# Patient Record
Sex: Male | Born: 1945 | Hispanic: No | Marital: Married | Smoking: Former smoker
Health system: Southern US, Community
[De-identification: ages and names within clinical notes are randomized; demographics above are authoritative.]

## PROBLEM LIST (undated history)

## (undated) DIAGNOSIS — I10 Essential (primary) hypertension: Secondary | ICD-10-CM

## (undated) DIAGNOSIS — K222 Esophageal obstruction: Secondary | ICD-10-CM

## (undated) DIAGNOSIS — K635 Polyp of colon: Secondary | ICD-10-CM

## (undated) DIAGNOSIS — M1990 Unspecified osteoarthritis, unspecified site: Secondary | ICD-10-CM

## (undated) DIAGNOSIS — E785 Hyperlipidemia, unspecified: Secondary | ICD-10-CM

## (undated) DIAGNOSIS — F1010 Alcohol abuse, uncomplicated: Secondary | ICD-10-CM

## (undated) DIAGNOSIS — K264 Chronic or unspecified duodenal ulcer with hemorrhage: Secondary | ICD-10-CM

## (undated) HISTORY — PX: EYE SURGERY: SHX253

## (undated) HISTORY — DX: Alcohol abuse, uncomplicated: F10.10

## (undated) HISTORY — DX: Polyp of colon: K63.5

## (undated) HISTORY — DX: Chronic or unspecified duodenal ulcer with hemorrhage: K26.4

## (undated) HISTORY — DX: Esophageal obstruction: K22.2

---

## 2009-06-17 ENCOUNTER — Ambulatory Visit: Payer: Self-pay | Admitting: Gastroenterology

## 2009-06-24 ENCOUNTER — Encounter: Payer: Self-pay | Admitting: Gastroenterology

## 2009-06-24 ENCOUNTER — Ambulatory Visit: Payer: Self-pay | Admitting: Gastroenterology

## 2009-06-24 DIAGNOSIS — K635 Polyp of colon: Secondary | ICD-10-CM

## 2009-06-24 HISTORY — DX: Polyp of colon: K63.5

## 2009-06-29 ENCOUNTER — Encounter: Payer: Self-pay | Admitting: Gastroenterology

## 2012-12-24 ENCOUNTER — Inpatient Hospital Stay (HOSPITAL_COMMUNITY): Payer: BC Managed Care – PPO

## 2012-12-24 ENCOUNTER — Encounter (HOSPITAL_COMMUNITY): Payer: Self-pay | Admitting: Emergency Medicine

## 2012-12-24 ENCOUNTER — Inpatient Hospital Stay (HOSPITAL_COMMUNITY)
Admission: EM | Admit: 2012-12-24 | Discharge: 2012-12-26 | DRG: 750 | Disposition: A | Discharge status: Home / Self Care | Payer: BC Managed Care – PPO | Attending: Internal Medicine | Admitting: Internal Medicine

## 2012-12-24 ENCOUNTER — Emergency Department (HOSPITAL_COMMUNITY): Payer: BC Managed Care – PPO

## 2012-12-24 DIAGNOSIS — J0120 Acute ethmoidal sinusitis, unspecified: Secondary | ICD-10-CM | POA: Diagnosis present

## 2012-12-24 DIAGNOSIS — E876 Hypokalemia: Secondary | ICD-10-CM | POA: Diagnosis present

## 2012-12-24 DIAGNOSIS — F1020 Alcohol dependence, uncomplicated: Secondary | ICD-10-CM | POA: Diagnosis present

## 2012-12-24 DIAGNOSIS — Z7289 Other problems related to lifestyle: Secondary | ICD-10-CM

## 2012-12-24 DIAGNOSIS — J0130 Acute sphenoidal sinusitis, unspecified: Secondary | ICD-10-CM | POA: Diagnosis present

## 2012-12-24 DIAGNOSIS — M129 Arthropathy, unspecified: Secondary | ICD-10-CM | POA: Diagnosis present

## 2012-12-24 DIAGNOSIS — Z79899 Other long term (current) drug therapy: Secondary | ICD-10-CM

## 2012-12-24 DIAGNOSIS — F10939 Alcohol use, unspecified with withdrawal, unspecified: Secondary | ICD-10-CM

## 2012-12-24 DIAGNOSIS — F10231 Alcohol dependence with withdrawal delirium: Principal | ICD-10-CM | POA: Diagnosis present

## 2012-12-24 DIAGNOSIS — F10239 Alcohol dependence with withdrawal, unspecified: Secondary | ICD-10-CM

## 2012-12-24 DIAGNOSIS — F411 Generalized anxiety disorder: Secondary | ICD-10-CM | POA: Diagnosis present

## 2012-12-24 DIAGNOSIS — K219 Gastro-esophageal reflux disease without esophagitis: Secondary | ICD-10-CM

## 2012-12-24 DIAGNOSIS — Z87891 Personal history of nicotine dependence: Secondary | ICD-10-CM

## 2012-12-24 DIAGNOSIS — E785 Hyperlipidemia, unspecified: Secondary | ICD-10-CM | POA: Diagnosis present

## 2012-12-24 DIAGNOSIS — Z23 Encounter for immunization: Secondary | ICD-10-CM

## 2012-12-24 DIAGNOSIS — R51 Headache: Secondary | ICD-10-CM | POA: Diagnosis present

## 2012-12-24 DIAGNOSIS — F1090 Alcohol use, unspecified, uncomplicated: Secondary | ICD-10-CM

## 2012-12-24 DIAGNOSIS — R519 Headache, unspecified: Secondary | ICD-10-CM | POA: Diagnosis present

## 2012-12-24 DIAGNOSIS — Z791 Long term (current) use of non-steroidal anti-inflammatories (NSAID): Secondary | ICD-10-CM

## 2012-12-24 DIAGNOSIS — K700 Alcoholic fatty liver: Secondary | ICD-10-CM | POA: Diagnosis present

## 2012-12-24 DIAGNOSIS — R490 Dysphonia: Secondary | ICD-10-CM | POA: Diagnosis present

## 2012-12-24 DIAGNOSIS — J019 Acute sinusitis, unspecified: Secondary | ICD-10-CM

## 2012-12-24 DIAGNOSIS — R000 Tachycardia, unspecified: Secondary | ICD-10-CM | POA: Diagnosis present

## 2012-12-24 DIAGNOSIS — I10 Essential (primary) hypertension: Secondary | ICD-10-CM

## 2012-12-24 DIAGNOSIS — F1010 Alcohol abuse, uncomplicated: Secondary | ICD-10-CM

## 2012-12-24 DIAGNOSIS — F10931 Alcohol use, unspecified with withdrawal delirium: Principal | ICD-10-CM | POA: Diagnosis present

## 2012-12-24 HISTORY — DX: Essential (primary) hypertension: I10

## 2012-12-24 HISTORY — DX: Hyperlipidemia, unspecified: E78.5

## 2012-12-24 HISTORY — DX: Unspecified osteoarthritis, unspecified site: M19.90

## 2012-12-24 LAB — CBC WITH DIFFERENTIAL/PLATELET
Eosinophils Absolute: 0 10*3/uL (ref 0.0–0.7)
HCT: 41.9 % (ref 39.0–52.0)
Hemoglobin: 14.6 g/dL (ref 13.0–17.0)
Lymphs Abs: 0.4 10*3/uL — ABNORMAL LOW (ref 0.7–4.0)
MCH: 33.3 pg (ref 26.0–34.0)
MCHC: 34.8 g/dL (ref 30.0–36.0)
Monocytes Absolute: 0.5 10*3/uL (ref 0.1–1.0)
Monocytes Relative: 5 % (ref 3–12)
Neutro Abs: 10 10*3/uL — ABNORMAL HIGH (ref 1.7–7.7)
Neutrophils Relative %: 91 % — ABNORMAL HIGH (ref 43–77)
RBC: 4.38 MIL/uL (ref 4.22–5.81)

## 2012-12-24 LAB — GLUCOSE, CAPILLARY

## 2012-12-24 LAB — POCT I-STAT, CHEM 8
Calcium, Ion: 1.24 mmol/L (ref 1.13–1.30)
Creatinine, Ser: 0.6 mg/dL (ref 0.50–1.35)
Glucose, Bld: 170 mg/dL — ABNORMAL HIGH (ref 70–99)
Hemoglobin: 16 g/dL (ref 13.0–17.0)
TCO2: 19 mmol/L (ref 0–100)

## 2012-12-24 LAB — HEPATIC FUNCTION PANEL
Albumin: 4.1 g/dL (ref 3.5–5.2)
Indirect Bilirubin: 0.8 mg/dL (ref 0.3–0.9)
Total Protein: 8.7 g/dL — ABNORMAL HIGH (ref 6.0–8.3)

## 2012-12-24 LAB — POCT I-STAT TROPONIN I: Troponin i, poc: 0.03 ng/mL (ref 0.00–0.08)

## 2012-12-24 MED ORDER — PANTOPRAZOLE SODIUM 40 MG PO TBEC
40.0000 mg | DELAYED_RELEASE_TABLET | Freq: Every day | ORAL | Status: DC
  Administered 2012-12-24 – 2012-12-26 (×3): 40 mg via ORAL
  Filled 2012-12-24 (×3): qty 1

## 2012-12-24 MED ORDER — THIAMINE HCL 100 MG/ML IJ SOLN
100.0000 mg | Freq: Every day | INTRAMUSCULAR | Status: DC
  Filled 2012-12-24 (×3): qty 1

## 2012-12-24 MED ORDER — ALUM & MAG HYDROXIDE-SIMETH 200-200-20 MG/5ML PO SUSP
30.0000 mL | Freq: Four times a day (QID) | ORAL | Status: DC | PRN
Start: 2012-12-24 — End: 2012-12-26

## 2012-12-24 MED ORDER — FOLIC ACID 1 MG PO TABS
1.0000 mg | ORAL_TABLET | Freq: Every day | ORAL | Status: DC
  Administered 2012-12-24 – 2012-12-26 (×3): 1 mg via ORAL
  Filled 2012-12-24 (×3): qty 1

## 2012-12-24 MED ORDER — ADULT MULTIVITAMIN W/MINERALS CH: 1.0000 | ORAL_TABLET | Freq: Every day | ORAL | Status: DC

## 2012-12-24 MED ORDER — KETOROLAC TROMETHAMINE 30 MG/ML IJ SOLN
30.0000 mg | Freq: Once | INTRAMUSCULAR | Status: AC
  Administered 2012-12-24: 30 mg via INTRAVENOUS
  Filled 2012-12-24: qty 1

## 2012-12-24 MED ORDER — PNEUMOCOCCAL VAC POLYVALENT 25 MCG/0.5ML IJ INJ: 0.5000 mL | INJECTION | INTRAMUSCULAR | Status: DC

## 2012-12-24 MED ORDER — IOHEXOL 350 MG/ML SOLN
100.0000 mL | Freq: Once | INTRAVENOUS | Status: AC | PRN
  Administered 2012-12-24: 100 mL via INTRAVENOUS

## 2012-12-24 MED ORDER — GUAIFENESIN ER 600 MG PO TB12
600.0000 mg | ORAL_TABLET | Freq: Two times a day (BID) | ORAL | Status: DC
  Administered 2012-12-24 – 2012-12-26 (×4): 600 mg via ORAL
  Filled 2012-12-24 (×5): qty 1

## 2012-12-24 MED ORDER — ONDANSETRON HCL 4 MG/2ML IJ SOLN: 4.0000 mg | Freq: Four times a day (QID) | INTRAMUSCULAR | Status: DC | PRN

## 2012-12-24 MED ORDER — ONDANSETRON HCL 4 MG PO TABS: 4.0000 mg | ORAL_TABLET | Freq: Four times a day (QID) | ORAL | Status: DC | PRN

## 2012-12-24 MED ORDER — LORAZEPAM 2 MG/ML IJ SOLN
1.0000 mg | Freq: Once | INTRAMUSCULAR | Status: AC
  Administered 2012-12-24: 1 mg via INTRAVENOUS
  Filled 2012-12-24: qty 1

## 2012-12-24 MED ORDER — LEVOFLOXACIN 500 MG PO TABS
500.0000 mg | ORAL_TABLET | ORAL | Status: DC
  Administered 2012-12-24 – 2012-12-25 (×2): 500 mg via ORAL
  Filled 2012-12-24 (×3): qty 1

## 2012-12-24 MED ORDER — SODIUM CHLORIDE 0.9 % IV BOLUS (SEPSIS)
1000.0000 mL | Freq: Once | INTRAVENOUS | Status: AC
  Administered 2012-12-24: 1000 mL via INTRAVENOUS

## 2012-12-24 MED ORDER — ADULT MULTIVITAMIN W/MINERALS CH
1.0000 | ORAL_TABLET | Freq: Every day | ORAL | Status: DC
  Administered 2012-12-24 – 2012-12-26 (×3): 1 via ORAL
  Filled 2012-12-24 (×3): qty 1

## 2012-12-24 MED ORDER — KETOROLAC TROMETHAMINE 30 MG/ML IJ SOLN
15.0000 mg | Freq: Once | INTRAMUSCULAR | Status: AC
  Administered 2012-12-24: 15 mg via INTRAVENOUS
  Filled 2012-12-24: qty 1

## 2012-12-24 MED ORDER — ATORVASTATIN CALCIUM 10 MG PO TABS
10.0000 mg | ORAL_TABLET | Freq: Every day | ORAL | Status: DC
  Administered 2012-12-25 – 2012-12-26 (×2): 10 mg via ORAL
  Filled 2012-12-24 (×4): qty 1

## 2012-12-24 MED ORDER — HEPARIN SODIUM (PORCINE) 5000 UNIT/ML IJ SOLN
5000.0000 [IU] | Freq: Three times a day (TID) | INTRAMUSCULAR | Status: DC
  Administered 2012-12-24 – 2012-12-25 (×4): 5000 [IU] via SUBCUTANEOUS
  Filled 2012-12-24 (×8): qty 1

## 2012-12-24 MED ORDER — OXYCODONE HCL 5 MG PO TABS
5.0000 mg | ORAL_TABLET | ORAL | Status: DC | PRN
  Administered 2012-12-24 – 2012-12-26 (×4): 5 mg via ORAL
  Filled 2012-12-24 (×4): qty 1

## 2012-12-24 MED ORDER — PNEUMOCOCCAL VAC POLYVALENT 25 MCG/0.5ML IJ INJ
0.5000 mL | INJECTION | INTRAMUSCULAR | Status: AC
  Administered 2012-12-25: 0.5 mL via INTRAMUSCULAR
  Filled 2012-12-24 (×3): qty 0.5

## 2012-12-24 MED ORDER — AMOXICILLIN-POT CLAVULANATE 875-125 MG PO TABS: 1.0000 | ORAL_TABLET | Freq: Two times a day (BID) | ORAL | Status: DC

## 2012-12-24 MED ORDER — NIFEDIPINE ER 60 MG PO TB24
60.0000 mg | ORAL_TABLET | Freq: Every day | ORAL | Status: DC
  Administered 2012-12-25 – 2012-12-26 (×2): 60 mg via ORAL
  Filled 2012-12-24 (×4): qty 1

## 2012-12-24 MED ORDER — ONDANSETRON HCL 4 MG/2ML IJ SOLN
4.0000 mg | Freq: Three times a day (TID) | INTRAMUSCULAR | Status: AC | PRN
  Administered 2012-12-24: 4 mg via INTRAVENOUS
  Filled 2012-12-24: qty 2

## 2012-12-24 MED ORDER — SODIUM CHLORIDE 0.9 % IV SOLN
INTRAVENOUS | Status: DC
  Administered 2012-12-24: 22:00:00 via INTRAVENOUS

## 2012-12-24 MED ORDER — SODIUM CHLORIDE 0.9 % IJ SOLN
3.0000 mL | Freq: Two times a day (BID) | INTRAMUSCULAR | Status: DC
  Administered 2012-12-24: 3 mL via INTRAVENOUS

## 2012-12-24 MED ORDER — METOCLOPRAMIDE HCL 5 MG/ML IJ SOLN
10.0000 mg | Freq: Once | INTRAMUSCULAR | Status: AC
  Administered 2012-12-24: 10 mg via INTRAVENOUS
  Filled 2012-12-24: qty 2

## 2012-12-24 MED ORDER — THIAMINE HCL 100 MG/ML IJ SOLN
Freq: Once | INTRAVENOUS | Status: DC
  Filled 2012-12-24: qty 1000

## 2012-12-24 MED ORDER — LORAZEPAM 1 MG PO TABS
1.0000 mg | ORAL_TABLET | Freq: Four times a day (QID) | ORAL | Status: DC | PRN
  Administered 2012-12-24 – 2012-12-25 (×2): 1 mg via ORAL
  Filled 2012-12-24 (×2): qty 1

## 2012-12-24 MED ORDER — DEXTROMETHORPHAN-GUAIFENESIN 10-200 MG PO CAPS: 1.0000 | ORAL_CAPSULE | ORAL | Status: DC | PRN

## 2012-12-24 MED ORDER — VITAMIN B-1 100 MG PO TABS
100.0000 mg | ORAL_TABLET | Freq: Every day | ORAL | Status: DC
  Administered 2012-12-24 – 2012-12-26 (×3): 100 mg via ORAL
  Filled 2012-12-24 (×3): qty 1

## 2012-12-24 MED ORDER — LORAZEPAM 2 MG/ML IJ SOLN: 1.0000 mg | Freq: Four times a day (QID) | INTRAMUSCULAR | Status: DC | PRN

## 2012-12-24 MED ORDER — LORAZEPAM 1 MG PO TABS: 1.0000 mg | ORAL_TABLET | ORAL | Status: DC | PRN

## 2012-12-24 NOTE — ED Notes (Signed)
At attempt to discharge pt, while sitting up on side of bed pt became diaphoretic, c/o indigestion and nausea. Pt placed on contiuous pulse ox and HR noted to be 145. Dondra Spry PA notified and to bedside to evaluate pt. Pt undressed and placed into gown and back on cardiac monitor.

## 2012-12-24 NOTE — ED Notes (Signed)
Pt c/o dizziness with change of position.

## 2012-12-24 NOTE — Progress Notes (Signed)
Pcp is dr Joycelyn Rua EPIC updated

## 2012-12-24 NOTE — ED Notes (Addendum)
Pt was at work and PA that comes in to their work wanted pt to come in for evaluation of HTN and tachycardia after being seen for sinus infection. Hx of HTN. Pt c/o of headache 7/10, denies dizziness and chest pain.

## 2012-12-24 NOTE — ED Notes (Signed)
Patient transported to CT 

## 2012-12-24 NOTE — H&P (Signed)
[facility] History and Physical  Donald Church ZOX:[id number] DOB: 02/09/1946 DOA: 12/24/2012  Referring physician: Raeford Razor, MD PCP: Joycelyn Rua, MD   Chief Complaint:  Alcohol withdrawal  HPI: Donald Church is a 67 y.o. male with past medical history of hypertension and hyperlipidemia, who was brought to the hospital initially because of high blood pressure.  He felt okay till about 2 days ago when he started to have some nasal congestion and hoarseness of voice, he so and then NP at his work today, when she checked him she was found him to have high blood pressure and tachycardia so she drove him to the hospital for further evaluation. Patient also mentioned occasional food regurgitation, he was taken OTC Sudafed and ibuprofen for his nasal congestion. Upon initial evaluation in the emergency department patient CT scan of the head was done and showed inflammatory changes in the paranasal sinuses, initially patient was scheduled for discharge as his blood pressure and tachycardia is likely secondary to his Sudafed. When patient was getting ready for discharge he felt very shaky, diaphoretic, tachycardic and anxious. His D. dimers were positive, CT angio of the chest was done and showed no PE. Patient said he used to drink 4-5 drinks a day for probably the past 15 years, he started to cut down about 2 weeks ago because of feeling tired and sometimes forgetful. Patient will be admitted to the hospital for further management of alcohol withdrawal.   Review of Systems:  Constitutional: Anxiety and sweats Eyes: negative for irritation, redness and visual disturbance Ears, nose, mouth, throat, and face: negative for earaches, epistaxis, nasal congestion and sore throat Respiratory: negative for cough, dyspnea on exertion, sputum and wheezing Cardiovascular: negative for chest pain, dyspnea, lower extremity edema, orthopnea, palpitations and syncope Gastrointestinal: negative for  abdominal pain, constipation, diarrhea, melena, nausea and vomiting Genitourinary:negative for dysuria, frequency and hematuria Hematologic/lymphatic: negative for bleeding, easy bruising and lymphadenopathy Musculoskeletal:negative for arthralgias, muscle weakness and stiff joints Neurological: negative for coordination problems, gait problems, headaches and weakness Endocrine: negative for diabetic symptoms including polydipsia, polyuria and weight loss Allergic/Immunologic: negative for anaphylaxis, hay fever and urticaria   Past Medical History  Diagnosis Date  . Hypertension   . Arthritis   . Hyperlipidemia    Past Surgical History  Procedure Date  . Eye surgery    Social History:  reports that he quit smoking about 34 years ago. He has never used smokeless tobacco. He reports that he drinks alcohol. He reports that he does not use illicit drugs. Lives at home with his wife.  No Known Allergies  Family History  Problem Relation Age of Onset  . Lymphoma Father   . Leukemia Mother     Prior to Admission medications   Medication Sig Start Date End Date Taking? Authorizing Provider  atorvastatin (LIPITOR) 10 MG tablet Take 10 mg by mouth daily before breakfast.   Yes Historical Provider, MD  ibuprofen (ADVIL,MOTRIN) 200 MG tablet Take 200 mg by mouth every 6 (six) hours as needed. Pain   Yes Historical Provider, MD  Multiple Vitamin (MULTIVITAMIN WITH MINERALS) TABS Take 1 tablet by mouth daily.   Yes Historical Provider, MD  NIFEdipine (PROCARDIA XL/ADALAT-CC) 60 MG 24 hr tablet Take 60 mg by mouth daily before breakfast.   Yes Historical Provider, MD  phenylephrine (SUDAFED PE) 10 MG TABS Take 10 mg by mouth every 4 (four) hours as needed. Nasal congestion   Yes Historical Provider, MD  amoxicillin-clavulanate (AUGMENTIN) 875-125 MG per  tablet Take 1 tablet by mouth every 12 (twelve) hours. 12/24/12   Antony Madura, PA-C  Dextromethorphan-Guaifenesin 10-200 MG CAPS Take 1-2  capsules by mouth every 4 (four) hours as needed (Take as needed for congestion/cough). 12/24/12   Antony Madura, PA-C   Physical Exam: Filed Vitals:   12/24/12 1530 12/24/12 1600 12/24/12 1630 12/24/12 1852  BP: 148/92 139/94 149/92 151/92  Pulse: 108 109 118 113  Temp:      TempSrc:      Resp: 22 25 28 30   SpO2: 95% 92% 92% 93%   General appearance: alert, cooperative and no distress  Head: Normocephalic, without obvious abnormality, atraumatic  Eyes: conjunctivae/corneas clear. PERRL, EOM's intact. Fundi benign.  Nose: Nares normal. Septum midline. Mucosa normal. No drainage or sinus tenderness.  Throat: lips, mucosa, and tongue normal; teeth and gums normal  Neck: Supple, no masses, no cervical lymphadenopathy, no JVD appreciated, no meningeal signs Resp: clear to auscultation bilaterally  Chest wall: no tenderness  Cardio: regular rate and rhythm, S1, S2 normal, no murmur, click, rub or gallop  GI: soft, non-tender; bowel sounds normal; no masses, no organomegaly  Extremities: extremities normal, atraumatic, no cyanosis or edema  Skin: Skin color, texture, turgor normal. No rashes or lesions  Neurologic: Alert and oriented X 3, normal strength and tone. Normal symmetric reflexes. Normal coordination and gait  Labs on Admission:  Basic Metabolic Panel:  Lab 12/24/12 1610  NA 130*  K 3.4*  CL 97  CO2 --  GLUCOSE 170*  BUN 8  CREATININE 0.60  CALCIUM --  MG --  PHOS --   Liver Function Tests:  Lab 12/24/12 1505  AST 90*  ALT 60*  ALKPHOS 70  BILITOT 1.1  PROT 8.7*  ALBUMIN 4.1   No results found for this basename: LIPASE:5,AMYLASE:5 in the last 168 hours No results found for this basename: AMMONIA:5 in the last 168 hours CBC:  Lab 12/24/12 1119 12/24/12 1105  WBC -- 11.0*  NEUTROABS -- 10.0*  HGB 16.0 14.6  HCT 47.0 41.9  MCV -- 95.7  PLT -- 207   Cardiac Enzymes: No results found for this basename: CKTOTAL:5,CKMB:5,CKMBINDEX:5,TROPONINI:5 in the last 168  hours  BNP (last 3 results) No results found for this basename: PROBNP:3 in the last 8760 hours CBG:  Lab 12/24/12 1517  GLUCAP 145*    Radiological Exams on Admission: Ct Head Wo Contrast  12/24/2012  *RADIOLOGY REPORT*  Clinical Data: Headache on the right.  CT HEAD WITHOUT CONTRAST  Technique:  Contiguous axial images were obtained from the base of the skull through the vertex without contrast.  Comparison: None.  Findings: The brain has a normal appearance without evidence of old or acute infarction, mass lesion, hemorrhage, hydrocephalus or extra-axial collection.  The calvarium is unremarkable.  There are inflammatory changes of the paranasal sinuses, most notable in the ethmoid and sphenoid region right more than left.  This could relate to the patient's pain.  IMPRESSION: No acute intracranial abnormality.  Inflammatory disease of the paranasal sinuses particularly effecting the right sphenoid and ethmoid region, which could relate to right-sided headache.   Original Report Authenticated By: Paulina Fusi, M.D.    Ct Angio Chest Pe W/cm &/or Wo Cm  12/24/2012  *RADIOLOGY REPORT*  Clinical Data: Tachycardia and diaphoresis.  Elevated D-dimer. Evaluate for pulmonary embolus.  CT ANGIOGRAPHY CHEST  Technique:  Multidetector CT imaging of the chest using the standard protocol during bolus administration of intravenous contrast. Multiplanar reconstructed images including MIPs  were obtained and reviewed to evaluate the vascular anatomy.  Contrast: OMNIPAQUE IOHEXOL 350 MG/ML SOLN  Comparison: None.  Findings: Due to poor opacification of the pulmonary arteries on the first attempt, a second bolus of contrast was administered and patient was rescanned.  On the second attempt, there is good opacification of the pulmonary arteries.  However, the apices were not imaged.  Therefore, the subsegmental pulmonary arteries cannot be evaluated in those areas.  Otherwise, no pulmonary embolus.  There are  calcified and noncalcified mediastinal lymph nodes. Noncalcified mediastinal lymph nodes are not enlarged by CT size criteria.  No hilar or axillary adenopathy.  Pulmonary arteries are enlarged.  Heart is at the upper limits of normal in size.  No pericardial effusion.  Thickening of the distal esophageal wall can be seen with gastroesophageal reflux disease.  Trace right pleural effusion.  Small left pleural effusion. Minimal dependent atelectasis in both lower lobes.  Airway is unremarkable.  Incidental imaging of the upper abdomen shows marked low attenuation throughout the visualized portion of the liver.  Liver appears prominent but is incompletely imaged.  No worrisome lytic or sclerotic lesions. Mild lower thoracic compression fracture, age indeterminate.  IMPRESSION:  1.  Limited assessment for apical upper lobe pulmonary emboli, as discussed above.  Otherwise, negative for pulmonary embolus. 2.  Tiny bilateral pleural effusions, left greater than right. 3.  Pulmonary arterial hypertension. 4.  Marked hepatic steatosis. 5.  Lower thoracic compression fracture, age indeterminate   Original Report Authenticated By: Leanna Battles, M.D.     EKG: Independently reviewed.   Assessment/Plan Active Problems:  Habitual alcohol use  Hypertension  Headache  Hoarseness of voice  GERD (gastroesophageal reflux disease)   Alcohol withdrawal -Patient cut down from 4-5 drinks to 2 drinks a day. -I don't know why he did not have alcohol withdrawal symptoms earlier. -Started on CIWA protocol, only utilize when patient has symptoms.  Acute sinusitis -CT scan of the head showed inflammatory changes in the right sphenoid and ethmoid sinuses. -This is likely the cause of his headache as he mentioned that it behind the right eye. -Started on Levaquin and Mucinex. Avoid Sudafed and other vasopressors  Hypertension -He had an episode of uncontrolled hypertension today, likely secondary to phenylephrine  (Sudafed). -Sudafed can cause high blood pressure and tachycardia as well. -His Procardia continued, Procardia itself can cause reflex tachycardia (he is been on it for years without symptoms).  Tachycardia -As mentioned above probably secondary to Sudafed, his acute sinusitis also might be contributing. -Had positive D. dimers, CT scan did not show PE. -If tachycardia continues, we will consider adding a beta blocker.  GERD -Patient mentioned some acid reflux symptoms, CT scan of the chest showed thickening of the esophagus. -Started on Protonix, and Mylanta, he will need PPI on discharge.  Alcohol use -Patient has habitual alcohol use was transaminitis, has AST is 90 and ALT is 60. -Check RUQ abdominal ultrasound, recheck LFTs in a.m.  Code Status: Full code Family Communication: Plan discussed with patient and his wife Disposition Plan: Inpatient  Time spent:  70 minutes  [facility] Pager [phone]  If 7PM-7AM, please contact night-coverage [email or web address] Password [facility] 12/24/2012, 7:50 PM

## 2012-12-24 NOTE — ED Provider Notes (Signed)
History     CSN: [id number]  Arrival date & time 12/24/12  1007   First MD Initiated Contact with Patient 12/24/12 1020      Chief Complaint  Patient presents with  . Tachycardia  . Hypertension    (Consider location/radiation/quality/duration/timing/severity/associated sxs/prior treatment) HPI Comments: Patient presents after being checked by his employer's NP for URI symptoms. After taking vitals, patient's BP was around 180/110 and he was tachy in the 130's. This prompted NP to send him here for evaluation.   Patient states he has had pain behind his right eye that is stabbing and non-radiating as well as a right sided headache since Monday with associated rhinorrhea and cough. Denies anything making the pain worse. Patient took 2 tabs sudafed this morning without symptom relief. Patient states he sometime coughs so hard that he vomits. He had had two episodes of non-bloody, non-bilious vomiting the last one being this morning. Patient also admits to some right sided jaw pain that began yesterday that is similar in nature to the pain he feels behind his eye. Denies photophobia and phonophobia, blurry vision, and other visual disturbances. Patient states he was evaluated by eye doctor yesterday and that he had a normal check up. Patient further denies shortness of breath, chest pain, numbness or tingling in his extremities, dizziness, syncope, swelling in his extremities, abdominal pain and diarrhea. Patient has Hx of HTN which is follow by his PCP, Dr. Lanier Ensign. He is on 60mg  Procardia and has been "for many years" which has adequately controlled his blood pressure. He takes his BP med every morning at 7:30am and admits to medication compliance.  Patient is a 67 y.o. male presenting with hypertension. The history is provided by the patient. No language interpreter was used.  Hypertension Associated symptoms include coughing, headaches and vomiting. Pertinent negatives include no  abdominal pain, chest pain, chills, fever, nausea or numbness.    Past Medical History  Diagnosis Date  . Hypertension   . Arthritis   . Hyperlipidemia     Past Surgical History  Procedure Date  . Eye surgery     Family History  Problem Relation Age of Onset  . Lymphoma Father   . Leukemia Mother     History  Substance Use Topics  . Smoking status: Former Smoker    Quit date: 12/24/1978  . Smokeless tobacco: Never Used  . Alcohol Use: Yes     Comment: 4 or 5 drinks per night      Review of Systems  Constitutional: Negative for fever and chills.  HENT: Positive for rhinorrhea and sinus pressure. Negative for hearing loss, ear pain and tinnitus.   Eyes: Negative for photophobia and visual disturbance.  Respiratory: Positive for cough. Negative for chest tightness and shortness of breath.   Cardiovascular: Negative for chest pain and leg swelling.  Gastrointestinal: Positive for vomiting. Negative for nausea, abdominal pain and diarrhea.  Genitourinary: Negative.   Skin: Negative.   Neurological: Positive for headaches. Negative for syncope, light-headedness and numbness.  Psychiatric/Behavioral: Negative for confusion.    Allergies  Review of patient's allergies indicates no known allergies.  Home Medications   Current Outpatient Rx  Name  Route  Sig  Dispense  Refill  . ATORVASTATIN CALCIUM 10 MG PO TABS   Oral   Take 10 mg by mouth daily before breakfast.         . IBUPROFEN 200 MG PO TABS   Oral   Take 200 mg by mouth every  6 (six) hours as needed. Pain         . ADULT MULTIVITAMIN W/MINERALS CH   Oral   Take 1 tablet by mouth daily.         Marland Kitchen NIFEDIPINE ER OSMOTIC 60 MG PO TB24   Oral   Take 60 mg by mouth daily before breakfast.         . PHENYLEPHRINE HCL 10 MG PO TABS   Oral   Take 10 mg by mouth every 4 (four) hours as needed. Nasal congestion         . AMOXICILLIN-POT CLAVULANATE 875-125 MG PO TABS   Oral   Take 1 tablet by  mouth every 12 (twelve) hours.   14 tablet   0   . DEXTROMETHORPHAN-GUAIFENESIN 10-200 MG PO CAPS   Oral   Take 1-2 capsules by mouth every 4 (four) hours as needed (Take as needed for congestion/cough).   30 each   0     BP 151/95  Pulse 110  Temp 99.1 F (37.3 C) (Oral)  Resp 24  SpO2 93%  Physical Exam  Constitutional: He is oriented to person, place, and time. He appears well-developed and well-nourished. No distress.  HENT:  Head: Normocephalic and atraumatic.    Mouth/Throat: Oropharynx is clear and moist.  Eyes: Conjunctivae normal are normal. Pupils are equal, round, and reactive to light. No scleral icterus.  Neck: Normal range of motion.  Pulmonary/Chest: Effort normal and breath sounds normal. No respiratory distress. He has no wheezes.  Abdominal: Soft. Bowel sounds are normal. He exhibits no distension and no mass. There is no tenderness. There is no guarding.  Musculoskeletal: Normal range of motion. He exhibits no edema.       Patient has tremor in his hands b/l. He states that this is normal for him and is not worse or more intense today than his usual baseline.  Neurological: He is alert and oriented to person, place, and time. Coordination normal.  Skin: No rash noted. He is not diaphoretic. No erythema. No pallor.  Psychiatric: He has a normal mood and affect. His behavior is normal.    ED Course  Procedures (including critical care time)  Labs Reviewed  CBC WITH DIFFERENTIAL - Abnormal; Notable for the following:    WBC 11.0 (*)     Neutrophils Relative 91 (*)     Neutro Abs 10.0 (*)     Lymphocytes Relative 4 (*)     Lymphs Abs 0.4 (*)     All other components within normal limits  POCT I-STAT, CHEM 8 - Abnormal; Notable for the following:    Sodium 130 (*)     Potassium 3.4 (*)     Glucose, Bld 170 (*)     All other components within normal limits  D-DIMER, QUANTITATIVE - Abnormal; Notable for the following:    D-Dimer, Quant 3.14 (*)      All other components within normal limits  GLUCOSE, CAPILLARY - Abnormal; Notable for the following:    Glucose-Capillary 145 (*)     All other components within normal limits  HEPATIC FUNCTION PANEL - Abnormal; Notable for the following:    Total Protein 8.7 (*)     AST 90 (*)     ALT 60 (*)     All other components within normal limits  POCT I-STAT TROPONIN I  POCT I-STAT TROPONIN I  ETHANOL   Ct Head Wo Contrast  12/24/2012  *RADIOLOGY REPORT*  Clinical Data: Headache  on the right.  CT HEAD WITHOUT CONTRAST  Technique:  Contiguous axial images were obtained from the base of the skull through the vertex without contrast.  Comparison: None.  Findings: The brain has a normal appearance without evidence of old or acute infarction, mass lesion, hemorrhage, hydrocephalus or extra-axial collection.  The calvarium is unremarkable.  There are inflammatory changes of the paranasal sinuses, most notable in the ethmoid and sphenoid region right more than left.  This could relate to the patient's pain.  IMPRESSION: No acute intracranial abnormality.  Inflammatory disease of the paranasal sinuses particularly effecting the right sphenoid and ethmoid region, which could relate to right-sided headache.   Original Report Authenticated By: Paulina Fusi, M.D.    Ct Angio Chest Pe W/cm &/or Wo Cm  12/24/2012  *RADIOLOGY REPORT*  Clinical Data: Tachycardia and diaphoresis.  Elevated D-dimer. Evaluate for pulmonary embolus.  CT ANGIOGRAPHY CHEST  Technique:  Multidetector CT imaging of the chest using the standard protocol during bolus administration of intravenous contrast. Multiplanar reconstructed images including MIPs were obtained and reviewed to evaluate the vascular anatomy.  Contrast: OMNIPAQUE IOHEXOL 350 MG/ML SOLN  Comparison: None.  Findings: Due to poor opacification of the pulmonary arteries on the first attempt, a second bolus of contrast was administered and patient was rescanned.  On the second  attempt, there is good opacification of the pulmonary arteries.  However, the apices were not imaged.  Therefore, the subsegmental pulmonary arteries cannot be evaluated in those areas.  Otherwise, no pulmonary embolus.  There are calcified and noncalcified mediastinal lymph nodes. Noncalcified mediastinal lymph nodes are not enlarged by CT size criteria.  No hilar or axillary adenopathy.  Pulmonary arteries are enlarged.  Heart is at the upper limits of normal in size.  No pericardial effusion.  Thickening of the distal esophageal wall can be seen with gastroesophageal reflux disease.  Trace right pleural effusion.  Small left pleural effusion. Minimal dependent atelectasis in both lower lobes.  Airway is unremarkable.  Incidental imaging of the upper abdomen shows marked low attenuation throughout the visualized portion of the liver.  Liver appears prominent but is incompletely imaged.  No worrisome lytic or sclerotic lesions. Mild lower thoracic compression fracture, age indeterminate.  IMPRESSION:  1.  Limited assessment for apical upper lobe pulmonary emboli, as discussed above.  Otherwise, negative for pulmonary embolus. 2.  Tiny bilateral pleural effusions, left greater than right. 3.  Pulmonary arterial hypertension. 4.  Marked hepatic steatosis. 5.  Lower thoracic compression fracture, age indeterminate   Original Report Authenticated By: Leanna Battles, M.D.      Date: 12/24/2012  Rate: 117  Rhythm: sinus tachycardia  QRS Axis: normal  Intervals: normal  ST/T Wave abnormalities: nonspecific ST changes  Conduction Disutrbances:none  Narrative Interpretation: EKG shows sinus tachycardia. No evidence of STEMI; no T-wave inversion  Old EKG Reviewed: none available   1. Sinusitis, acute   2. Headache   3. Alcohol withdrawal       MDM  11:30 Patient presents after found to be hypertensive and tachycardic by employer's MD today who was seeing for pain behind the R eye and right sided  headache x 2 days. Patient took 2 tabs Sudafed this morning; took his 60mg  daily dose of Procardia this AM as well. Patient's symptoms unlikely to be cardiac; EKG findings included sinus tachycardia only and troponin not elevated. Have consulted with Dr. Rosalia Hammers regarding the patient who believes further work up of headache should be explored. CT head  w/o contrast in process.  13:30 CT shows inflammatory changes of the paranasal sinuses, likely related to the patient's right sided headache. Patient's blood pressure remains elevated, however this is likely due to taking Sudafed this morning for sinus symptoms. Patient given fluid bolus in light of two episodes of emesis and tachycardia; heart rate down to 116 from 143. BP has also dropped from 176/111 to 150/92. Patient also has had great relief of headache and nausea symptoms with Toradol and Reglan.  14:15 Patient discussed with Dr. Rosalia Hammers and is stable for discharge. Patient will be given script for Coricidin HBP as well as Augmentin for 7 days for sinusitis treatment. Patient should also follow up with PCP regarding blood pressure in the next 24 hours. Patient states he has a BP cuff at home. Has been instructed to take BP in morning after resting seated in a chair for 5 minutes. Patient should return to ED if symptoms worsen and/or if he begins to experience cardiac related symptoms such as chest pain, left sided jaw pain, or numbness or tingling in his left arm. Patient states he is comfortable going home and states will be compliant with follow up.  15:00 In preparing for discharge, patient became extremely diaphoretic, increasingly tachycardic and began complaining of some indigestion. Patient will remain in ED; repeat EKG and troponin ordered as well as orthostatics, a d-dimer and another liter bolus of fluid. CBG 145 down from 171 so symptoms not related to sudden hypoglycemia.  15:30 Per nurse who spoke with wife, wife states husband drinks regularly  and for a while was drinking more than usual; she states has been trying to drink less for the past week or so. Patient stated on initial exam that his tremors are normal for him, but possible that they are DT secondary to alcohol withdrawal. Am obtaining ethanol level. Second troponin still normal at 0.03.  16:39 Elevated D-dimer at 3.14. CT angio chest ordered. Some relief of tremors with ativan.  17:45 Patient's CTA not ideal as did not visualize apices of lungs; however what was visible was negative for PE. Most likely that symptoms patient is experiencing is secondary to alcohol withdrawal. He states that he had some relief of his tremors with the ativan. Have discussed the patient with Dr. Juleen China; believe patient warrants further observation as inpatient for alcohol withdrawal at this time. Dr. Juleen China has put in a consult to the [facility]. They have agreed to admit the patient for further monitoring and management.  Filed Vitals:   12/24/12 1630 12/24/12 1852 12/24/12 1900 12/24/12 1930  BP: 149/92 151/92 148/88 151/95  Pulse: 118 113 110 110  Temp:      TempSrc:      Resp: 28 30 24 24   SpO2: 92% 93% 93% 93%            Antony Madura, PA-C 12/24/12 2001

## 2012-12-24 NOTE — ED Provider Notes (Signed)
.  Medical screening examination/treatment/procedure(s) were conducted as a shared visit with non-physician practitioner(s) and myself.  I personally evaluated the patient during the encounter.  67 year old male with alcoholic withdrawal. Tachycardic heart rate at 130s 140s. Hypertensive. Tremulous and diaphoretic. D-dimer ordered prior to history that patient had been cutting back his alcohol use. Was elevated and he subsequently had a CT angiogram. Unfortunately this was not an optimal study with apices not being incompletely visualized. Clinically the patient's symptoms are liekly related to alcoholic withdrawal and not pulmonary embolism. Not complaining of any chest pain or shortness of breath. He has no significant risk factors. I would not treat patient presumptively for PE at this time. Will for alcohol withdrawal.  Raeford Razor, MD 12/24/12 [phone]

## 2012-12-24 NOTE — ED Notes (Addendum)
Pt states that he has been drinking multiple drinks for a few years now and has now over the past couple of weeks tried to cut back. PA notified.

## 2012-12-25 ENCOUNTER — Inpatient Hospital Stay (HOSPITAL_COMMUNITY): Payer: BC Managed Care – PPO

## 2012-12-25 DIAGNOSIS — R51 Headache: Secondary | ICD-10-CM

## 2012-12-25 LAB — COMPREHENSIVE METABOLIC PANEL
ALT: 47 U/L (ref 0–53)
Albumin: 3.3 g/dL — ABNORMAL LOW (ref 3.5–5.2)
BUN: 13 mg/dL (ref 6–23)
Calcium: 9 mg/dL (ref 8.4–10.5)
GFR calc Af Amer: >90 mL/min (ref >90)
Glucose, Bld: 103 mg/dL — ABNORMAL HIGH (ref 70–99)
Sodium: 132 mEq/L — ABNORMAL LOW (ref 135–145)
Total Protein: 7 g/dL (ref 6.0–8.3)

## 2012-12-25 LAB — HEMOGLOBIN A1C: Hgb A1c MFr Bld: 5.3 % (ref <5.7)

## 2012-12-25 LAB — TSH: TSH: 2.052 u[IU]/mL (ref 0.350–4.500)

## 2012-12-25 LAB — CBC
Hemoglobin: 14.1 g/dL (ref 13.0–17.0)
MCH: 33.7 pg (ref 26.0–34.0)
MCHC: 35 g/dL (ref 30.0–36.0)
RDW: 12.7 % (ref 11.5–15.5)

## 2012-12-25 LAB — PROTIME-INR: Prothrombin Time: 15 seconds (ref 11.6–15.2)

## 2012-12-25 MED ORDER — MAGNESIUM SULFATE 40 MG/ML IJ SOLN
2.0000 g | Freq: Once | INTRAMUSCULAR | Status: AC
  Administered 2012-12-25: 2 g via INTRAVENOUS
  Filled 2012-12-25: qty 50

## 2012-12-25 MED ORDER — POTASSIUM CHLORIDE CRYS ER 20 MEQ PO TBCR
40.0000 meq | EXTENDED_RELEASE_TABLET | Freq: Once | ORAL | Status: AC
  Administered 2012-12-25: 40 meq via ORAL
  Filled 2012-12-25: qty 2

## 2012-12-25 NOTE — Progress Notes (Addendum)
[facility] PROGRESS NOTE  Donald Church ZOX:[id number] DOB: [birth year] DOA: 12/24/2012 PCP: Joycelyn Rua, MD  Assessment/Plan: Alcohol withdrawal  -Patient cut down from 4-5 drinks to 2 drinks a day.  -I don't know why he did not have alcohol withdrawal symptoms earlier.  -Started on CIWA protocol, only utilize when patient has symptoms.   Acute sinusitis  -CT scan of the head showed inflammatory changes in the right sphenoid and ethmoid sinuses.  -This is likely the cause of his headache as he mentioned that it behind the right eye.  -Started on Levaquin and Mucinex. Avoid Sudafed and other vasopressors   Hypertension  -He had an episode of uncontrolled hypertension today, likely secondary to phenylephrine (Sudafed).  -Sudafed can cause high blood pressure and tachycardia as well.  -His Procardia continued, Procardia itself can cause reflex tachycardia (he is been on it for years without symptoms).   Tachycardia  -As mentioned above probably secondary to Sudafed, his acute sinusitis also might be contributing.  -Had positive D. dimers, CT scan did not show PE.  -If tachycardia continues, we will consider adding a beta blocker.   GERD  -Patient mentioned some acid reflux symptoms, CT scan of the chest showed thickening of the esophagus.  -Started on Protonix, and Mylanta, he will need PPI on discharge.   Alcohol use  -Patient has habitual alcohol use was transaminitis, has AST is 90 and ALT is 60.  - RUQ abdominal ultrasound done- cirrhosis vs fatty liver, LFTs decreasing.  Hypokalemia -repleat  hypomagnesium -repleat   Code Status: full Family Communication: wife/son at bedside Disposition Plan: ? rehab   Consultants:  Social work  Procedures:    Antibiotics:    HPI/Subjective: Has tremors Considering rehab for alcohol abuse Sinusitis is better    Objective: Filed Vitals:   12/24/12 2205 12/24/12 2345 12/24/12 2358 12/25/12 0505  BP:  153/96  152/89 134/79  Pulse: 110  104 93  Temp: 98.9 F (37.2 C)  98.2 F (36.8 C) 98.5 F (36.9 C)  TempSrc: Oral  Oral Oral  Resp: 22  20 22   Height:  5\' 7"  (1.702 m)    Weight:  78.5 kg (173 lb 1 oz)    SpO2: 93%  94% 95%    Intake/Output Summary (Last 24 hours) at 12/25/12 1248 Last data filed at 12/25/12 0656  Gross per 24 hour  Intake 646.25 ml  Output    400 ml  Net 246.25 ml   Filed Weights   12/24/12 2345  Weight: 78.5 kg (173 lb 1 oz)    Exam:   General:  Frail appearing male  Cardiovascular: rrr  Respiratory: clear  Abdomen: +BS, distended  Data Reviewed: Basic Metabolic Panel:  Lab 12/25/12 8119 12/24/12 1119  NA 132* 130*  K 3.2* 3.4*  CL 95* 97  CO2 17* --  GLUCOSE 103* 170*  BUN 13 8  CREATININE 0.46* 0.60  CALCIUM 9.0 --  MG 1.4* --  PHOS -- --   Liver Function Tests:  Lab 12/25/12 0458 12/24/12 1505  AST 69* 90*  ALT 47 60*  ALKPHOS 62 70  BILITOT 0.8 1.1  PROT 7.0 8.7*  ALBUMIN 3.3* 4.1   No results found for this basename: LIPASE:5,AMYLASE:5 in the last 168 hours No results found for this basename: AMMONIA:5 in the last 168 hours CBC:  Lab 12/25/12 0458 12/24/12 1119 12/24/12 1105  WBC 10.1 -- 11.0*  NEUTROABS -- -- 10.0*  HGB 14.1 16.0 14.6  HCT 40.3 47.0 41.9  MCV 96.4 -- 95.7  PLT 174 -- 207   Cardiac Enzymes: No results found for this basename: CKTOTAL:5,CKMB:5,CKMBINDEX:5,TROPONINI:5 in the last 168 hours BNP (last 3 results) No results found for this basename: PROBNP:3 in the last 8760 hours CBG:  Lab 12/24/12 1517  GLUCAP 145*    No results found for this or any previous visit (from the past 240 hour(s)).   Studies: Ct Head Wo Contrast  12/24/2012  *RADIOLOGY REPORT*  Clinical Data: Headache on the right.  CT HEAD WITHOUT CONTRAST  Technique:  Contiguous axial images were obtained from the base of the skull through the vertex without contrast.  Comparison: None.  Findings: The brain has a normal  appearance without evidence of old or acute infarction, mass lesion, hemorrhage, hydrocephalus or extra-axial collection.  The calvarium is unremarkable.  There are inflammatory changes of the paranasal sinuses, most notable in the ethmoid and sphenoid region right more than left.  This could relate to the patient's pain.  IMPRESSION: No acute intracranial abnormality.  Inflammatory disease of the paranasal sinuses particularly effecting the right sphenoid and ethmoid region, which could relate to right-sided headache.   Original Report Authenticated By: Paulina Fusi, M.D.    Ct Angio Chest Pe W/cm &/or Wo Cm  12/24/2012  *RADIOLOGY REPORT*  Clinical Data: Tachycardia and diaphoresis.  Elevated D-dimer. Evaluate for pulmonary embolus.  CT ANGIOGRAPHY CHEST  Technique:  Multidetector CT imaging of the chest using the standard protocol during bolus administration of intravenous contrast. Multiplanar reconstructed images including MIPs were obtained and reviewed to evaluate the vascular anatomy.  Contrast: OMNIPAQUE IOHEXOL 350 MG/ML SOLN  Comparison: None.  Findings: Due to poor opacification of the pulmonary arteries on the first attempt, a second bolus of contrast was administered and patient was rescanned.  On the second attempt, there is good opacification of the pulmonary arteries.  However, the apices were not imaged.  Therefore, the subsegmental pulmonary arteries cannot be evaluated in those areas.  Otherwise, no pulmonary embolus.  There are calcified and noncalcified mediastinal lymph nodes. Noncalcified mediastinal lymph nodes are not enlarged by CT size criteria.  No hilar or axillary adenopathy.  Pulmonary arteries are enlarged.  Heart is at the upper limits of normal in size.  No pericardial effusion.  Thickening of the distal esophageal wall can be seen with gastroesophageal reflux disease.  Trace right pleural effusion.  Small left pleural effusion. Minimal dependent atelectasis in both lower  lobes.  Airway is unremarkable.  Incidental imaging of the upper abdomen shows marked low attenuation throughout the visualized portion of the liver.  Liver appears prominent but is incompletely imaged.  No worrisome lytic or sclerotic lesions. Mild lower thoracic compression fracture, age indeterminate.  IMPRESSION:  1.  Limited assessment for apical upper lobe pulmonary emboli, as discussed above.  Otherwise, negative for pulmonary embolus. 2.  Tiny bilateral pleural effusions, left greater than right. 3.  Pulmonary arterial hypertension. 4.  Marked hepatic steatosis. 5.  Lower thoracic compression fracture, age indeterminate   Original Report Authenticated By: Leanna Battles, M.D.    US Abdomen Complete  12/25/2012  *RADIOLOGY REPORT*  Clinical Data:  Hypertension, hyperlipidemia, abnormal LFTs/transaminitis, alcohol withdrawal  ULTRASOUND ABDOMEN:  Technique:  Sonography of upper abdominal structures was performed.  Comparison:  None  Gallbladder:  Tiny intraluminal nonshadowing echogenic focus question tiny polyp 4 mm diameter.  No definite shadowing gallstones, wall thickening or pericholecystic fluid.  No sonographic Murphy's sign.  Common bile duct:  4 mm diameter,  normal.  Liver:  Echogenic, likely fatty infiltration, though this can be seen with cirrhosis and certain infiltrative disorders.  Poor sound transmission through liver limiting assessment.  No gross hepatic mass or nodularity.  Unable to exclude intrahepatic pathology by this exam.  Hepatopetal portal venous flow.  IVC:  Normal appearance  Pancreas:  Incomplete visualization of portions of the head and tail with visualized portions normal appearance.  Spleen:  Normal appearance, 6.2 cm length  Right kidney:  12.2 cm length. Normal morphology without mass or hydronephrosis.  Left kidney:  12.5 cm length. Normal morphology without mass or hydronephrosis.  Aorta:  Normal caliber  Other:  No free fluid  IMPRESSION: Tiny gallbladder polyp. Probable  fatty infiltration of liver as above. Inadequate assessment of intrahepatic detail due to poor sound transmission through a markedly echogenic liver, unable to exclude intrahepatic pathology by this exam. If better intrahepatic visualization is required recommend evaluation by CT with IV and oral contrast.   Original Report Authenticated By: Ulyses Southward, M.D.     Scheduled Meds:   . atorvastatin  10 mg Oral QAC breakfast  . folic acid  1 mg Oral Daily  . guaiFENesin  600 mg Oral BID  . heparin  5,000 Units Subcutaneous Q8H  . levofloxacin  500 mg Oral Q24H  . magnesium sulfate 1 - 4 g bolus IVPB  2 g Intravenous Once  . multivitamin with minerals  1 tablet Oral Daily  . NIFEdipine  60 mg Oral QAC breakfast  . pantoprazole  40 mg Oral Daily  . sodium chloride  3 mL Intravenous Q12H  . thiamine  100 mg Oral Daily   Or  . thiamine  100 mg Intravenous Daily   Continuous Infusions:   . sodium chloride 75 mL/hr at 12/24/12 2219    Active Problems:  Habitual alcohol use  Hypertension  Headache  Hoarseness of voice  GERD (gastroesophageal reflux disease)    Time spent: 35    [facility], Mitali Shenefield  [facility] Pager [phone]. If 8PM-8AM, please contact night-coverage at [email or web address], password [facility] 12/25/2012, 12:48 PM  LOS: 1 day

## 2012-12-25 NOTE — Care Management Note (Signed)
    Page 1 of 1   12/26/2012     2:35:38 PM   CARE MANAGEMENT NOTE 12/26/2012  Patient:  ANDRES, VEST   Account Number:  [id number]  Date Initiated:  12/25/2012  Documentation initiated by:  Lanier Clam  Subjective/Objective Assessment:   ADMITTED W/ETOH USE.     Action/Plan:   FROM HOME.HAS PCP,PHARMACY.   Anticipated DC Date:  12/26/2012   Anticipated DC Plan:  HOME/SELF CARE      DC Planning Services  CM consult      Choice offered to / List presented to:             Status of service:  Completed, signed off Medicare Important Message given?   (If response is "NO", the following Medicare IM given date fields will be blank) Date Medicare IM given:   Date Additional Medicare IM given:    Discharge Disposition:  HOME/SELF CARE  Per UR Regulation:  Reviewed for med. necessity/level of care/duration of stay  If discussed at Long Length of Stay Meetings, dates discussed:    Comments:  12/25/12 Viney Acocella RN,BSN NCM [phone] MAY NEED OTPT REHAB RESOURCES.

## 2012-12-25 NOTE — Plan of Care (Signed)
Problem: Phase I Progression Outcomes Goal: Initial discharge plan identified Outcome: Completed/Met Date Met:  12/25/12 Home with wife.

## 2012-12-25 NOTE — Evaluation (Signed)
Physical Therapy Evaluation Patient Details Name: Donald Church MRN: [id number] DOB: 11/24/1945 Today's Date: 12/25/2012 Time: 0865-7846 PT Time Calculation (min): 28 min  PT Assessment / Plan / Recommendation Clinical Impression  67 yo male admitted with increased HR and ETOH withdrawl.  He has some ataxia and tachycardia with gait, but he is happy to be moving. around.  Although he had some initial incoordination with use of straight cane anticipate he will continue to improve in gait  stability with its use  Recommend straight cane for home, but he does not need follow up PT once discharged    PT Assessment  Patient needs continued PT services    Follow Up Recommendations  Other (comment)    Does the patient have the potential to tolerate intense rehabilitation      Barriers to Discharge        Equipment Recommendations  Cane    Recommendations for Other Services     Frequency Min 3X/week    Precautions / Restrictions Precautions Precautions: Fall   Pertinent Vitals/Pain Noc/o pain,  HR 136-140 with activity      Mobility  Bed Mobility Bed Mobility: Supine to Sit Supine to Sit: 7: Independent Transfers Transfers: Sit to Stand;Stand to Sit Sit to Stand: 7: Independent Stand to Sit: 7: Independent Ambulation/Gait Ambulation/Gait Assistance: 5: Supervision Ambulation Distance (Feet): 400 Feet (200' with RW, 200' with st. cane) Assistive device: Rolling walker;Straight cane Ambulation/Gait Assistance Details: pt needs some  balance assistance for ambulation as he has some incoordination with UE and LE synchonis movement especially with straight cane in gait Gait Pattern: Step-through pattern;Ataxic Gait velocity: decreased General Gait Details: Pt does well with RW, however he would like to use a straight cane at home.  Upon initial attempt, he had some incoordination with gait pattern with cane and was not able to perform reiprocal arm swing and use of cane.  He  used cane in left hand for extra balance support He will need continued pattern with emphasis on technique.  Pt has HR sustained aroun 136-140 duiring gait.  His HR did decrase some with slower pace and use of RW Stairs: No Wheelchair Mobility Wheelchair Mobility: No    Exercises Other Exercises Other Exercises: emphasis on abdominal sets and glute sets in standiing for core stability   PT Diagnosis: Difficulty walking;Abnormality of gait;Generalized weakness  PT Problem List: Decreased activity tolerance;Decreased coordination;Decreased knowledge of use of DME PT Treatment Interventions: DME instruction;Gait training;Stair training   PT Goals Acute Rehab PT Goals PT Goal Formulation: With patient Time For Goal Achievement: 01/01/13 Potential to Achieve Goals: Good Pt will Ambulate: >150 feet;with modified independence;with cane PT Goal: Ambulate - Progress: Goal set today Pt will Go Up / Down Stairs: 3-5 stairs;with modified independence;with cane PT Goal: Up/Down Stairs - Progress: Goal set today  Visit Information  Last PT Received On: 12/25/12 Assistance Needed: +1    Subjective Data  Subjective: "Its better than it was"  re:  elevated HR Patient Stated Goal: To go home maybe tomorrow   Prior Functioning  Home Living Lives With: Spouse Available Help at Discharge: Family Type of Home: House Home Access: Stairs to enter Secretary/administrator of Steps: 2 Home Layout: Two level Home Adaptive Equipment: None Prior Function Level of Independence: Independent Able to Take Stairs?: Yes Communication Communication: No difficulties    Cognition  Cognition Overall Cognitive Status: Appears within functional limits for tasks assessed/performed Arousal/Alertness: Awake/alert Orientation Level: Appears intact for tasks assessed Behavior During  Session: [facility] for tasks performed    Extremity/Trunk Assessment Right Lower Extremity Assessment RLE ROM/Strength/Tone: Within  functional levels Left Lower Extremity Assessment LLE ROM/Strength/Tone: Within functional levels Trunk Assessment Trunk Assessment: Normal   Balance Balance Balance Assessed: Yes Static Sitting Balance Static Sitting - Balance Support: Feet supported;No upper extremity supported Static Sitting - Level of Assistance: 7: Independent Static Standing Balance Static Standing - Balance Support: No upper extremity supported;During functional activity Static Standing - Level of Assistance: 5: Stand by assistance Static Standing - Comment/# of Minutes: Pt had some balace loss with initial movements in room  End of Session PT - End of Session Activity Tolerance: Treatment limited secondary to medical complications (Comment) (increased HR with activity)  GP    Rosey Bath K. [place], [place] [phone]  12/25/2012, 2:33 PM

## 2012-12-25 NOTE — Progress Notes (Signed)
Clinical Social Work Department BRIEF PSYCHOSOCIAL ASSESSMENT 12/25/2012  Patient:  Donald Church, Donald Church     Account Number:  [id number]     Admit date:  12/24/2012  Clinical Social Worker:  Hattie Perch  Date/Time:  12/25/2012 12:00 M  Referred by:  Physician  Date Referred:  12/25/2012 Referred for  Substance Abuse   Other Referral:   Interview type:  Patient Other interview type:   wife and son at bedside.    PSYCHOSOCIAL DATA Living Status:  FAMILY Admitted from facility:   Level of care:   Primary support name:  Donald Church Primary support relationship to patient:  SPOUSE Degree of support available:   good    CURRENT CONCERNS Current Concerns  Post-Acute Placement   Other Concerns:    SOCIAL WORK ASSESSMENT / PLAN CSW met with patient and patient spouse at bedside. patient is alert and oriented X3. Patient requests son and spouse be present. CSW addressed patient's drinking issue with patient. Patient states that he typically has 5 vodka drinks a night and has been drinking all his adult life but heavily for the past five years. about 3 weeks ago he spoke with his wife and was trying to cut down because he heard it was bad to stop cold [place]. CSW spoke in depth about inpatient versus outpatient treatment with patient. He will think about it until tomorrow and CSW will come back to discuss options.   Assessment/plan status:   Other assessment/ plan:   Information/referral to community resources:    PATIENT'S/FAMILY'S RESPONSE TO PLAN OF CARE: CSW will come back to discuss inpatient versus outpatient.

## 2012-12-26 DIAGNOSIS — I10 Essential (primary) hypertension: Secondary | ICD-10-CM

## 2012-12-26 LAB — COMPREHENSIVE METABOLIC PANEL
ALT: 42 U/L (ref 0–53)
AST: 52 U/L — ABNORMAL HIGH (ref 0–37)
Alkaline Phosphatase: 60 U/L (ref 39–117)
CO2: 18 mEq/L — ABNORMAL LOW (ref 19–32)
Chloride: 96 mEq/L (ref 96–112)
GFR calc non Af Amer: >90 mL/min (ref >90)
Sodium: 130 mEq/L — ABNORMAL LOW (ref 135–145)
Total Bilirubin: 0.7 mg/dL (ref 0.3–1.2)

## 2012-12-26 LAB — CBC
Platelets: 169 10*3/uL (ref 150–400)
RBC: 4.09 MIL/uL — ABNORMAL LOW (ref 4.22–5.81)
WBC: 9 10*3/uL (ref 4.0–10.5)

## 2012-12-26 MED ORDER — LORAZEPAM 0.5 MG PO TABS: 0.5000 mg | ORAL_TABLET | Freq: Four times a day (QID) | ORAL | Status: DC | PRN

## 2012-12-26 MED ORDER — OXYCODONE HCL 5 MG PO TABS: 5.0000 mg | ORAL_TABLET | ORAL | Status: DC | PRN

## 2012-12-26 MED ORDER — FOLIC ACID 1 MG PO TABS: 1.0000 mg | ORAL_TABLET | Freq: Every day | ORAL | Status: DC

## 2012-12-26 MED ORDER — LEVOFLOXACIN 500 MG PO TABS: 500.0000 mg | ORAL_TABLET | ORAL | Status: DC

## 2012-12-26 MED ORDER — POTASSIUM CHLORIDE CRYS ER 20 MEQ PO TBCR
40.0000 meq | EXTENDED_RELEASE_TABLET | Freq: Once | ORAL | Status: AC
  Administered 2012-12-26: 40 meq via ORAL
  Filled 2012-12-26 (×2): qty 2

## 2012-12-26 MED ORDER — PANTOPRAZOLE SODIUM 40 MG PO TBEC: 40.0000 mg | DELAYED_RELEASE_TABLET | Freq: Every day | ORAL | Status: DC

## 2012-12-26 MED ORDER — THIAMINE HCL 100 MG PO TABS: 100.0000 mg | ORAL_TABLET | Freq: Every day | ORAL | Status: DC

## 2012-12-26 MED ORDER — GUAIFENESIN ER 600 MG PO TB12: 600.0000 mg | ORAL_TABLET | Freq: Two times a day (BID) | ORAL | Status: DC

## 2012-12-26 NOTE — Progress Notes (Signed)
Physical Therapy Treatment Patient Details Name: Donald Church MRN: [id number] DOB: 08-Dec-1945 Today's Date: 12/26/2012 Time: 7829-5621 PT Time Calculation (min): 8 min  PT Assessment / Plan / Recommendation Comments on Treatment Session  Pt able to ambulate without cane today and had steady gait, also performed stairs.  Pt to d/c to [facility] and no other PT needs identified.  Pt, family, and RN aware of elevated HR with activity and pt reports he will f/u with MD tomorrow at appt.    Follow Up Recommendations  No PT follow up     Does the patient have the potential to tolerate intense rehabilitation     Barriers to Discharge        Equipment Recommendations  None recommended by PT    Recommendations for Other Services    Frequency     Plan All goals met and education completed, patient dischaged from PT services    Precautions / Restrictions Precautions Precautions: Fall   Pertinent Vitals/Pain HR 141 bpm with ambulation, RN notified    Mobility  Transfers Transfers: Sit to Stand;Stand to Sit Sit to Stand: 7: Independent Stand to Sit: 7: Independent Ambulation/Gait Ambulation/Gait Assistance: 6: Modified independent (Device/Increase time) Ambulation Distance (Feet): 400 Feet Ambulation/Gait Assistance Details: didn't really use SPC in room so ambulated without in hallway and pt did well with no unsteadiness or LOB Gait Pattern: Step-through pattern General Gait Details: HR 141 during gait (RN aware) Stairs: Yes Stairs Assistance: 5: Supervision Stairs Assistance Details (indicate cue type and reason): educated on stair technique if using step to pattern however pt able to demonstrate step through with only verbal cues to not graze foot on step but use more hip flexion to clear step x3 Stair Management Technique: One rail Right Number of Stairs: 10     Exercises     PT Diagnosis:    PT Problem List:   PT Treatment Interventions:     PT Goals Acute Rehab  PT Goals PT Goal: Ambulate - Progress: Met PT Goal: Up/Down Stairs - Progress: Partly met (supervision for flight however feel could do 3 steps mod I)  Visit Information  Last PT Received On: 12/26/12 Assistance Needed: +1    Subjective Data  Subjective: I'm doing great.   Cognition  Cognition Overall Cognitive Status: Appears within functional limits for tasks assessed/performed Arousal/Alertness: Awake/alert Orientation Level: Appears intact for tasks assessed Behavior During Session: [facility] for tasks performed    Balance     End of Session PT - End of Session Activity Tolerance: Patient tolerated treatment well Patient left: in chair;with nursing in room;with family/visitor present Nurse Communication: Other (comment) (RN saw gait and aware of elevated HR with activity)   GP     Donald Church,KATHrine E 12/26/2012, 2:20 PM Zenovia Jarred, PT, DPT 12/26/2012 Pager: [phone]

## 2012-12-26 NOTE — Progress Notes (Signed)
[facility]. [facility]. No other CSW needs noted.  Teya Otterson C. Curry Seefeldt MSW, LCSW [phone]

## 2012-12-26 NOTE — Discharge Summary (Signed)
Physician Discharge Summary  Donald Church ZOX:[id number] DOB: 02/07/1946 DOA: 12/24/2012  PCP: Joycelyn Rua, MD  Admit date: 12/24/2012 Discharge date: 12/26/2012  Time spent: 35 minutes  Recommendations for Outpatient Follow-up:  1. Inpatient alcohol treatment  Discharge Diagnoses:  Active Problems:  Habitual alcohol use  Hypertension  Headache  Hoarseness of voice  GERD (gastroesophageal reflux disease)   Discharge Condition: improved  Diet recommendation: cardiac  Filed Weights   12/24/12 2345  Weight: 78.5 kg (173 lb 1 oz)    History of present illness:  Donald Church is a 67 y.o. male with past medical history of hypertension and hyperlipidemia, who was brought to the hospital initially because of high blood pressure. He felt okay till about 2 days ago when he started to have some nasal congestion and hoarseness of voice, he so and then NP at his work today, when she checked him she was found him to have high blood pressure and tachycardia so she drove him to the hospital for further evaluation. Patient also mentioned occasional food regurgitation, he was taken OTC Sudafed and ibuprofen for his nasal congestion. Upon initial evaluation in the emergency department patient CT scan of the head was done and showed inflammatory changes in the paranasal sinuses, initially patient was scheduled for discharge as his blood pressure and tachycardia is likely secondary to his Sudafed. When patient was getting ready for discharge he felt very shaky, diaphoretic, tachycardic and anxious. His D. dimers were positive, CT angio of the chest was done and showed no PE. Patient said he used to drink 4-5 drinks a day for probably the past 15 years, he started to cut down about 2 weeks ago because of feeling tired and sometimes forgetful. Patient will be admitted to the hospital for further management of alcohol withdrawal.   Hospital Course:  Alcohol withdrawal  -Patient cut down from 4-5  drinks to 2 drinks a day.  -I don't know why he did not have alcohol withdrawal symptoms earlier.  -Started on CIWA protocol has not scored in last 24 hours -patient wants to go to inpatient rehab at [facility].   Acute sinusitis  -CT scan of the head showed inflammatory changes in the right sphenoid and ethmoid sinuses.  -This is likely the cause of his headache as he mentioned that it behind the right eye.  -Started on Levaquin and Mucinex. Avoid Sudafed and other vasopressors   Hypertension  -He had an episode of uncontrolled hypertension today, likely secondary to phenylephrine (Sudafed).  -Sudafed can cause high blood pressure and tachycardia as well.  -His Procardia continued, Procardia itself can cause reflex tachycardia (he is been on it for years without symptoms).   Tachycardia  -As mentioned above probably secondary to Sudafed, his acute sinusitis also might be contributing.  -Had positive D. dimers, CT scan did not show PE.  -If tachycardia continues, we will consider adding a beta blocker.   GERD  -Patient mentioned some acid reflux symptoms, CT scan of the chest showed thickening of the esophagus.  -Started on Protonix, and Mylanta, he will need PPI on discharge.   Alcohol use  -Patient has habitual alcohol use was transaminitis, has AST is 90 and ALT is 60.  - RUQ abdominal ultrasound done- cirrhosis vs fatty liver, LFTs decreasing.   Hypokalemia  -repleat   hypomagnesium  -repleat   Procedures:  none  Consultations:  Social work  Discharge Exam: Filed Vitals:   12/25/12 1317 12/25/12 1405 12/25/12 2056 12/26/12 0521  BP:  139/89  151/94 146/91  Pulse: 103 140 115 98  Temp: 98.5 F (36.9 C)  98.2 F (36.8 C) 99.7 F (37.6 C)  TempSrc: Oral  Oral Oral  Resp: 24  22 22   Height:      Weight:      SpO2: 95%  93% 94%    General: A+Ox3, decreased tremors Cardiovascular: rrr Respiratory: clear  Discharge Instructions      Discharge Orders     Future Orders Please Complete By Expires   Diet - low sodium heart healthy      Increase activity slowly      Discharge instructions      Comments:   Inpatient alcohol rehab   Discharge instructions      Comments:   CBC, BMP in 1 week       Medication List     As of 12/26/2012  1:14 PM    STOP taking these medications         ibuprofen 200 MG tablet   Commonly known as: ADVIL,MOTRIN      phenylephrine 10 MG Tabs   Commonly known as: SUDAFED PE      TAKE these medications         atorvastatin 10 MG tablet   Commonly known as: LIPITOR   Take 10 mg by mouth daily before breakfast.      folic acid 1 MG tablet   Commonly known as: FOLVITE   Take 1 tablet (1 mg total) by mouth daily.      guaiFENesin 600 MG 12 hr tablet   Commonly known as: MUCINEX   Take 1 tablet (600 mg total) by mouth 2 (two) times daily.      levofloxacin 500 MG tablet   Commonly known as: LEVAQUIN   Take 1 tablet (500 mg total) by mouth daily.      LORazepam 0.5 MG tablet   Commonly known as: ATIVAN   Take 1 tablet (0.5 mg total) by mouth every 6 (six) hours as needed for anxiety.      multivitamin with minerals Tabs   Take 1 tablet by mouth daily.      NIFEdipine 60 MG 24 hr tablet   Commonly known as: PROCARDIA XL/ADALAT-CC   Take 60 mg by mouth daily before breakfast.      oxyCODONE 5 MG immediate release tablet   Commonly known as: Oxy IR/ROXICODONE   Take 1 tablet (5 mg total) by mouth every 4 (four) hours as needed.      pantoprazole 40 MG tablet   Commonly known as: PROTONIX   Take 1 tablet (40 mg total) by mouth daily.      thiamine 100 MG tablet   Take 1 tablet (100 mg total) by mouth daily.         Follow-up Information    Follow up with Dr. Lenise Arena. In 1 day. (Follow up with primary doctor in 24 hours regarding stay and evaluation of BP)       Follow up with [facility]. (If symptoms worsen)    Contact information:   [address] [id number] mc [place] [address] [phone]          The results of significant diagnostics from this hospitalization (including imaging, microbiology, ancillary and laboratory) are listed below for reference.    Significant Diagnostic Studies: Ct Head Wo Contrast  12/24/2012  *RADIOLOGY REPORT*  Clinical Data: Headache on the right.  CT HEAD WITHOUT CONTRAST  Technique:  Contiguous axial images were obtained from the base of the skull through the vertex without contrast.  Comparison: None.  Findings: The brain has a normal appearance without evidence of old or acute infarction, mass lesion, hemorrhage, hydrocephalus or extra-axial collection.  The calvarium is unremarkable.  There are inflammatory changes of the paranasal sinuses, most notable in the ethmoid and sphenoid region right more than left.  This could relate to the patient's pain.  IMPRESSION: No acute intracranial abnormality.  Inflammatory disease of the paranasal sinuses particularly effecting the right sphenoid and ethmoid region, which could relate to right-sided headache.   Original Report Authenticated By: Paulina Fusi, M.D.    Ct Angio Chest Pe W/cm &/or Wo Cm  12/24/2012  *RADIOLOGY REPORT*  Clinical Data: Tachycardia and diaphoresis.  Elevated D-dimer. Evaluate for pulmonary embolus.  CT ANGIOGRAPHY CHEST  Technique:  Multidetector CT imaging of the chest using the standard protocol during bolus administration of intravenous contrast. Multiplanar reconstructed images including MIPs were obtained and reviewed to evaluate the vascular anatomy.  Contrast: OMNIPAQUE IOHEXOL 350 MG/ML SOLN  Comparison: None.  Findings: Due to poor opacification of the pulmonary arteries on the first attempt, a second bolus of contrast was administered and patient was rescanned.  On the second attempt, there is good opacification of the pulmonary arteries.  However, the apices were not imaged.  Therefore, the subsegmental  pulmonary arteries cannot be evaluated in those areas.  Otherwise, no pulmonary embolus.  There are calcified and noncalcified mediastinal lymph nodes. Noncalcified mediastinal lymph nodes are not enlarged by CT size criteria.  No hilar or axillary adenopathy.  Pulmonary arteries are enlarged.  Heart is at the upper limits of normal in size.  No pericardial effusion.  Thickening of the distal esophageal wall can be seen with gastroesophageal reflux disease.  Trace right pleural effusion.  Small left pleural effusion. Minimal dependent atelectasis in both lower lobes.  Airway is unremarkable.  Incidental imaging of the upper abdomen shows marked low attenuation throughout the visualized portion of the liver.  Liver appears prominent but is incompletely imaged.  No worrisome lytic or sclerotic lesions. Mild lower thoracic compression fracture, age indeterminate.  IMPRESSION:  1.  Limited assessment for apical upper lobe pulmonary emboli, as discussed above.  Otherwise, negative for pulmonary embolus. 2.  Tiny bilateral pleural effusions, left greater than right. 3.  Pulmonary arterial hypertension. 4.  Marked hepatic steatosis. 5.  Lower thoracic compression fracture, age indeterminate   Original Report Authenticated By: Leanna Battles, M.D.    US Abdomen Complete  12/25/2012  *RADIOLOGY REPORT*  Clinical Data:  Hypertension, hyperlipidemia, abnormal LFTs/transaminitis, alcohol withdrawal  ULTRASOUND ABDOMEN:  Technique:  Sonography of upper abdominal structures was performed.  Comparison:  None  Gallbladder:  Tiny intraluminal nonshadowing echogenic focus question tiny polyp 4 mm diameter.  No definite shadowing gallstones, wall thickening or pericholecystic fluid.  No sonographic Murphy's sign.  Common bile duct:  4 mm diameter, normal.  Liver:  Echogenic, likely fatty infiltration, though this can be seen with cirrhosis and certain infiltrative disorders.  Poor sound transmission through liver limiting  assessment.  No gross hepatic mass or nodularity.  Unable to exclude intrahepatic pathology by this exam.  Hepatopetal portal venous flow.  IVC:  Normal appearance  Pancreas:  Incomplete visualization of portions of the head and tail with visualized portions normal appearance.  Spleen:  Normal appearance, 6.2 cm length  Right kidney:  12.2 cm length. Normal morphology without mass or hydronephrosis.  Left kidney:  12.5 cm length. Normal morphology without mass or hydronephrosis.  Aorta:  Normal caliber  Other:  No free fluid  IMPRESSION: Tiny gallbladder polyp. Probable fatty infiltration of liver as above. Inadequate assessment of intrahepatic detail due to poor sound transmission through a markedly echogenic liver, unable to exclude intrahepatic pathology by this exam. If better intrahepatic visualization is required recommend evaluation by CT with IV and oral contrast.   Original Report Authenticated By: Ulyses Southward, M.D.     Microbiology: No results found for this or any previous visit (from the past 240 hour(s)).   Labs: Basic Metabolic Panel:  Lab 12/26/12 1610 12/25/12 0458 12/24/12 1119  NA 130* 132* 130*  K 3.3* 3.2* 3.4*  CL 96 95* 97  CO2 18* 17* --  GLUCOSE 93 103* 170*  BUN 11 13 8   CREATININE 0.45* 0.46* 0.60  CALCIUM 8.3* 9.0 --  MG -- 1.4* --  PHOS -- -- --   Liver Function Tests:  Lab 12/26/12 0445 12/25/12 0458 12/24/12 1505  AST 52* 69* 90*  ALT 42 47 60*  ALKPHOS 60 62 70  BILITOT 0.7 0.8 1.1  PROT 6.8 7.0 8.7*  ALBUMIN 2.9* 3.3* 4.1   No results found for this basename: LIPASE:5,AMYLASE:5 in the last 168 hours No results found for this basename: AMMONIA:5 in the last 168 hours CBC:  Lab 12/26/12 0440 12/25/12 0458 12/24/12 1119 12/24/12 1105  WBC 9.0 10.1 -- 11.0*  NEUTROABS -- -- -- 10.0*  HGB 13.7 14.1 16.0 14.6  HCT 39.4 40.3 47.0 41.9  MCV 96.3 96.4 -- 95.7  PLT 169 174 -- 207   Cardiac Enzymes: No results found for this basename:  CKTOTAL:5,CKMB:5,CKMBINDEX:5,TROPONINI:5 in the last 168 hours BNP: BNP (last 3 results) No results found for this basename: PROBNP:3 in the last 8760 hours CBG:  Lab 12/24/12 1517  GLUCAP 145*       Signed:  Bhavya Grand  [facility] 12/26/2012, 1:14 PM

## 2012-12-27 NOTE — ED Provider Notes (Signed)
History/physical exam/procedure(s) were performed by non-physician practitioner and as supervising physician I was immediately available for consultation/collaboration. I have reviewed all notes and am in agreement with care and plan.   Hilario Quarry, MD 12/27/12 [phone]

## 2014-01-06 ENCOUNTER — Encounter (HOSPITAL_COMMUNITY): Payer: Self-pay | Admitting: Emergency Medicine

## 2014-01-06 ENCOUNTER — Emergency Department (HOSPITAL_COMMUNITY): Payer: BC Managed Care – PPO

## 2014-01-06 ENCOUNTER — Encounter (HOSPITAL_COMMUNITY)
Admission: EM | Disposition: A | Discharge status: Home / Self Care | Payer: Self-pay | Source: Home / Self Care | Attending: Internal Medicine

## 2014-01-06 ENCOUNTER — Inpatient Hospital Stay (HOSPITAL_COMMUNITY)
Admission: EM | Admit: 2014-01-06 | Discharge: 2014-01-08 | DRG: 378 | Disposition: A | Discharge status: Home / Self Care | Payer: BC Managed Care – PPO | Attending: Internal Medicine | Admitting: Internal Medicine

## 2014-01-06 DIAGNOSIS — K7689 Other specified diseases of liver: Secondary | ICD-10-CM | POA: Diagnosis present

## 2014-01-06 DIAGNOSIS — K222 Esophageal obstruction: Secondary | ICD-10-CM | POA: Diagnosis present

## 2014-01-06 DIAGNOSIS — K922 Gastrointestinal hemorrhage, unspecified: Secondary | ICD-10-CM | POA: Diagnosis present

## 2014-01-06 DIAGNOSIS — E861 Hypovolemia: Secondary | ICD-10-CM | POA: Diagnosis present

## 2014-01-06 DIAGNOSIS — Z7982 Long term (current) use of aspirin: Secondary | ICD-10-CM

## 2014-01-06 DIAGNOSIS — Z791 Long term (current) use of non-steroidal anti-inflammatories (NSAID): Secondary | ICD-10-CM

## 2014-01-06 DIAGNOSIS — D62 Acute posthemorrhagic anemia: Secondary | ICD-10-CM | POA: Diagnosis present

## 2014-01-06 DIAGNOSIS — K219 Gastro-esophageal reflux disease without esophagitis: Secondary | ICD-10-CM

## 2014-01-06 DIAGNOSIS — E876 Hypokalemia: Secondary | ICD-10-CM | POA: Diagnosis present

## 2014-01-06 DIAGNOSIS — D649 Anemia, unspecified: Secondary | ICD-10-CM | POA: Diagnosis present

## 2014-01-06 DIAGNOSIS — R002 Palpitations: Secondary | ICD-10-CM | POA: Diagnosis present

## 2014-01-06 DIAGNOSIS — I10 Essential (primary) hypertension: Secondary | ICD-10-CM | POA: Diagnosis present

## 2014-01-06 DIAGNOSIS — K264 Chronic or unspecified duodenal ulcer with hemorrhage: Secondary | ICD-10-CM

## 2014-01-06 DIAGNOSIS — E785 Hyperlipidemia, unspecified: Secondary | ICD-10-CM | POA: Diagnosis present

## 2014-01-06 DIAGNOSIS — Z79899 Other long term (current) drug therapy: Secondary | ICD-10-CM

## 2014-01-06 DIAGNOSIS — F1021 Alcohol dependence, in remission: Secondary | ICD-10-CM

## 2014-01-06 DIAGNOSIS — D72829 Elevated white blood cell count, unspecified: Secondary | ICD-10-CM | POA: Diagnosis present

## 2014-01-06 DIAGNOSIS — M129 Arthropathy, unspecified: Secondary | ICD-10-CM | POA: Diagnosis present

## 2014-01-06 DIAGNOSIS — Z87891 Personal history of nicotine dependence: Secondary | ICD-10-CM

## 2014-01-06 DIAGNOSIS — I959 Hypotension, unspecified: Secondary | ICD-10-CM | POA: Diagnosis present

## 2014-01-06 HISTORY — PX: ESOPHAGOGASTRODUODENOSCOPY: SHX5428

## 2014-01-06 HISTORY — DX: Chronic or unspecified duodenal ulcer with hemorrhage: K26.4

## 2014-01-06 HISTORY — DX: Esophageal obstruction: K22.2

## 2014-01-06 LAB — COMPREHENSIVE METABOLIC PANEL
ALBUMIN: 2.4 g/dL — AB (ref 3.5–5.2)
ALK PHOS: 26 U/L — AB (ref 39–117)
ALT: 9 U/L (ref 0–53)
AST: 16 U/L (ref 0–37)
BUN: 17 mg/dL (ref 6–23)
CO2: 18 mEq/L — ABNORMAL LOW (ref 19–32)
Calcium: 6.9 mg/dL — ABNORMAL LOW (ref 8.4–10.5)
Chloride: 111 mEq/L (ref 96–112)
Creatinine, Ser: 0.65 mg/dL (ref 0.50–1.35)
GFR calc Af Amer: >90 mL/min (ref >90)
GFR calc non Af Amer: >90 mL/min (ref >90)
Glucose, Bld: 118 mg/dL — ABNORMAL HIGH (ref 70–99)
POTASSIUM: 3 meq/L — AB (ref 3.7–5.3)
SODIUM: 140 meq/L (ref 137–147)
TOTAL PROTEIN: 4.3 g/dL — AB (ref 6.0–8.3)
Total Bilirubin: <0.2 mg/dL — ABNORMAL LOW (ref 0.3–1.2)

## 2014-01-06 LAB — CBC WITH DIFFERENTIAL/PLATELET
BASOS ABS: 0.1 10*3/uL (ref 0.0–0.1)
BASOS PCT: 1 % (ref 0–1)
Basophils Absolute: 0.1 10*3/uL (ref 0.0–0.1)
Basophils Relative: 1 % (ref 0–1)
EOS PCT: 1 % (ref 0–5)
Eosinophils Absolute: 0.1 10*3/uL (ref 0.0–0.7)
Eosinophils Absolute: 0.1 10*3/uL (ref 0.0–0.7)
Eosinophils Relative: 1 % (ref 0–5)
HEMATOCRIT: 13 % — AB (ref 39.0–52.0)
HEMATOCRIT: 16.7 % — AB (ref 39.0–52.0)
Hemoglobin: 4.3 g/dL — CL (ref 13.0–17.0)
Hemoglobin: 5.6 g/dL — CL (ref 13.0–17.0)
LYMPHS PCT: 16 % (ref 12–46)
LYMPHS PCT: 14 % (ref 12–46)
Lymphs Abs: 2.7 10*3/uL (ref 0.7–4.0)
Lymphs Abs: 2 10*3/uL (ref 0.7–4.0)
MCH: 32.3 pg (ref 26.0–34.0)
MCH: 31.6 pg (ref 26.0–34.0)
MCHC: 33.1 g/dL (ref 30.0–36.0)
MCHC: 33.5 g/dL (ref 30.0–36.0)
MCV: 97.7 fL (ref 78.0–100.0)
MCV: 94.4 fL (ref 78.0–100.0)
MONO ABS: 1.3 10*3/uL — AB (ref 0.1–1.0)
MONO ABS: 1.3 10*3/uL — AB (ref 0.1–1.0)
Monocytes Relative: 8 % (ref 3–12)
Monocytes Relative: 9 % (ref 3–12)
NEUTROS ABS: 12.5 10*3/uL — AB (ref 1.7–7.7)
Neutro Abs: 11.1 10*3/uL — ABNORMAL HIGH (ref 1.7–7.7)
Neutrophils Relative %: 75 % (ref 43–77)
Neutrophils Relative %: 76 % (ref 43–77)
Platelets: 262 10*3/uL (ref 150–400)
Platelets: 225 10*3/uL (ref 150–400)
RBC: 1.33 MIL/uL — ABNORMAL LOW (ref 4.22–5.81)
RBC: 1.77 MIL/uL — ABNORMAL LOW (ref 4.22–5.81)
RDW: 15.4 % (ref 11.5–15.5)
RDW: 15.8 % — ABNORMAL HIGH (ref 11.5–15.5)
WBC: 16.7 10*3/uL — AB (ref 4.0–10.5)
WBC: 14.6 10*3/uL — ABNORMAL HIGH (ref 4.0–10.5)

## 2014-01-06 LAB — BASIC METABOLIC PANEL
BUN: 27 mg/dL — ABNORMAL HIGH (ref 6–23)
CHLORIDE: 105 meq/L (ref 96–112)
CO2: 21 meq/L (ref 19–32)
CREATININE: 0.7 mg/dL (ref 0.50–1.35)
Calcium: 7.8 mg/dL — ABNORMAL LOW (ref 8.4–10.5)
GFR calc Af Amer: >90 mL/min (ref >90)
GFR calc non Af Amer: >90 mL/min (ref >90)
Glucose, Bld: 120 mg/dL — ABNORMAL HIGH (ref 70–99)
Potassium: 3.4 mEq/L — ABNORMAL LOW (ref 3.7–5.3)
SODIUM: 136 meq/L — AB (ref 137–147)

## 2014-01-06 LAB — PREPARE RBC (CROSSMATCH)

## 2014-01-06 LAB — GLUCOSE, CAPILLARY: Glucose-Capillary: 115 mg/dL — ABNORMAL HIGH (ref 70–99)

## 2014-01-06 LAB — ABO/RH: ABO/RH(D): O POS

## 2014-01-06 LAB — PROTIME-INR
INR: 1.32 (ref 0.00–1.49)
Prothrombin Time: 16.1 seconds — ABNORMAL HIGH (ref 11.6–15.2)

## 2014-01-06 LAB — MRSA PCR SCREENING: MRSA by PCR: NEGATIVE

## 2014-01-06 LAB — TROPONIN I

## 2014-01-06 LAB — OCCULT BLOOD, POC DEVICE: FECAL OCCULT BLD: POSITIVE — AB

## 2014-01-06 SURGERY — EGD (ESOPHAGOGASTRODUODENOSCOPY)
Anesthesia: Moderate Sedation

## 2014-01-06 MED ORDER — ONDANSETRON HCL 4 MG/2ML IJ SOLN: 4.0000 mg | Freq: Four times a day (QID) | INTRAMUSCULAR | Status: DC | PRN

## 2014-01-06 MED ORDER — BUTAMBEN-TETRACAINE-BENZOCAINE 2-2-14 % EX AERO
INHALATION_SPRAY | CUTANEOUS | Status: DC | PRN
  Administered 2014-01-06: 2 via TOPICAL

## 2014-01-06 MED ORDER — DIPHENHYDRAMINE HCL 50 MG/ML IJ SOLN
INTRAMUSCULAR | Status: AC
  Filled 2014-01-06: qty 1

## 2014-01-06 MED ORDER — EPINEPHRINE HCL 1 MG/ML IJ SOLN
INTRAMUSCULAR | Status: DC | PRN
  Administered 2014-01-06: 3 mL

## 2014-01-06 MED ORDER — MIDAZOLAM HCL 10 MG/2ML IJ SOLN
INTRAMUSCULAR | Status: DC | PRN
  Administered 2014-01-06 (×2): 1 mg via INTRAVENOUS

## 2014-01-06 MED ORDER — MIDAZOLAM HCL 10 MG/2ML IJ SOLN
INTRAMUSCULAR | Status: AC
  Filled 2014-01-06: qty 2

## 2014-01-06 MED ORDER — SODIUM CHLORIDE 0.9 % IJ SOLN
3.0000 mL | Freq: Two times a day (BID) | INTRAMUSCULAR | Status: DC
  Administered 2014-01-07 – 2014-01-08 (×2): 3 mL via INTRAVENOUS

## 2014-01-06 MED ORDER — EPINEPHRINE HCL 0.1 MG/ML IJ SOSY
PREFILLED_SYRINGE | INTRAMUSCULAR | Status: AC
  Filled 2014-01-06: qty 10

## 2014-01-06 MED ORDER — DEXTROSE 5 % IV SOLN
1.0000 g | Freq: Two times a day (BID) | INTRAVENOUS | Status: DC
  Administered 2014-01-06 – 2014-01-08 (×5): 1 g via INTRAVENOUS
  Filled 2014-01-06 (×7): qty 1

## 2014-01-06 MED ORDER — SODIUM CHLORIDE 0.9 % IV SOLN
80.0000 mg | Freq: Once | INTRAVENOUS | Status: AC
  Administered 2014-01-06: 80 mg via INTRAVENOUS
  Filled 2014-01-06: qty 80

## 2014-01-06 MED ORDER — CHLORHEXIDINE GLUCONATE 0.12 % MT SOLN
15.0000 mL | Freq: Two times a day (BID) | OROMUCOSAL | Status: DC
  Administered 2014-01-07 – 2014-01-08 (×3): 15 mL via OROMUCOSAL
  Filled 2014-01-06 (×4): qty 15

## 2014-01-06 MED ORDER — SODIUM CHLORIDE 0.9 % IV BOLUS (SEPSIS)
1000.0000 mL | Freq: Once | INTRAVENOUS | Status: AC
  Administered 2014-01-06: 1000 mL via INTRAVENOUS

## 2014-01-06 MED ORDER — SODIUM CHLORIDE 0.9 % IV SOLN: INTRAVENOUS | Status: DC

## 2014-01-06 MED ORDER — SODIUM CHLORIDE 0.9 % IV BOLUS (SEPSIS)
2000.0000 mL | Freq: Once | INTRAVENOUS | Status: AC
  Administered 2014-01-06: 2000 mL via INTRAVENOUS

## 2014-01-06 MED ORDER — BIOTENE DRY MOUTH MT LIQD
15.0000 mL | Freq: Two times a day (BID) | OROMUCOSAL | Status: DC
  Administered 2014-01-06 – 2014-01-08 (×4): 15 mL via OROMUCOSAL

## 2014-01-06 MED ORDER — ACETAMINOPHEN 325 MG PO TABS: 650.0000 mg | ORAL_TABLET | Freq: Four times a day (QID) | ORAL | Status: DC | PRN

## 2014-01-06 MED ORDER — PANTOPRAZOLE SODIUM 40 MG IV SOLR: 40.0000 mg | Freq: Two times a day (BID) | INTRAVENOUS | Status: DC

## 2014-01-06 MED ORDER — ACETAMINOPHEN 650 MG RE SUPP
650.0000 mg | Freq: Four times a day (QID) | RECTAL | Status: DC | PRN
Start: 2014-01-06 — End: 2014-01-08

## 2014-01-06 MED ORDER — SODIUM CHLORIDE 0.9 % IV SOLN
INTRAVENOUS | Status: AC
  Administered 2014-01-06 – 2014-01-07 (×2): via INTRAVENOUS

## 2014-01-06 MED ORDER — FENTANYL CITRATE 0.05 MG/ML IJ SOLN
INTRAMUSCULAR | Status: AC
  Filled 2014-01-06: qty 2

## 2014-01-06 MED ORDER — SODIUM CHLORIDE 0.9 % IV SOLN
8.0000 mg/h | INTRAVENOUS | Status: DC
  Administered 2014-01-06 – 2014-01-07 (×4): 8 mg/h via INTRAVENOUS
  Filled 2014-01-06 (×10): qty 80

## 2014-01-06 MED ORDER — DIPHENHYDRAMINE HCL 50 MG/ML IJ SOLN
25.0000 mg | Freq: Once | INTRAMUSCULAR | Status: AC
  Administered 2014-01-06: 25 mg via INTRAVENOUS

## 2014-01-06 MED ORDER — FENTANYL CITRATE 0.05 MG/ML IJ SOLN
INTRAMUSCULAR | Status: DC | PRN
  Administered 2014-01-06 (×2): 12.5 ug via INTRAVENOUS

## 2014-01-06 MED ORDER — ONDANSETRON HCL 4 MG PO TABS: 4.0000 mg | ORAL_TABLET | Freq: Four times a day (QID) | ORAL | Status: DC | PRN

## 2014-01-06 NOTE — Progress Notes (Signed)
Same day note: H and P from this AM reviewed. Pt presents with acute blood loss anemia with concerns for UGI bleeding as a source. GI consulted. Pt is now s/p endoscopy with findings of a bleeding postbulbar ulcer s/p epi and clips. Will cont follow cbc and transfuse as needed.

## 2014-01-06 NOTE — ED Notes (Signed)
Bed: [id number] Expected date:  Expected time:  Means of arrival:  Comments: EMS 68yo GI Bleed + orthostatic VS

## 2014-01-06 NOTE — Consult Note (Signed)
Name: Donald Church MRN: [id number] DOB: 1945/12/06    ADMISSION DATE:  01/06/2014 CONSULTATION DATE:  01/06/14    REFERRING MD :  [facility] PRIMARY SERVICE: [facility]  CHIEF COMPLAINT:  Hypotension, GIB   BRIEF PATIENT DESCRIPTION: 68yo male with hx HTN, previous hx ETOH quit one year ago. Has been taking NSAIDs for knee pain presented 2/18 with 3 day hx black tarry stools.  In ER hypotensive with Hgb 4 and PCCM consulted.   SIGNIFICANT EVENTS / STUDIES:  EGD 2/18>>>  LINES / TUBES: none  CULTURES: none  ANTIBIOTICS: none  HISTORY OF PRESENT ILLNESS:  68yo male with hx HTN, previous hx ETOH quit one year ago. Has been taking NSAIDs for knee pain presented 2/18 with 3 day hx black tarry stools.  In ER hypotensive with Hgb 4 and PCCM consulted.  Denies abd pain, n/v/d.  Denies chest pain but does c/o intermittent palpitations.   PAST MEDICAL HISTORY :  Past Medical History  Diagnosis Date  . Hypertension   . Arthritis   . Hyperlipidemia    Past Surgical History  Procedure Laterality Date  . Eye surgery     Prior to Admission medications   Medication Sig Start Date End Date Taking? Authorizing Provider  aspirin EC 81 MG tablet Take 81 mg by mouth daily.   Yes Historical Provider, MD  atorvastatin (LIPITOR) 10 MG tablet Take 10 mg by mouth daily before breakfast.   Yes Historical Provider, MD  ibuprofen (ADVIL,MOTRIN) 200 MG tablet Take 600 mg by mouth every 6 (six) hours as needed for headache or moderate pain.   Yes Historical Provider, MD  Multiple Vitamin (MULTIVITAMIN WITH MINERALS) TABS Take 1 tablet by mouth daily.   Yes Historical Provider, MD  NIFEdipine (PROCARDIA XL/ADALAT-CC) 60 MG 24 hr tablet Take 60 mg by mouth daily before breakfast.   Yes Historical Provider, MD   No Known Allergies  FAMILY HISTORY:  Family History  Problem Relation Age of Onset  . Lymphoma Father   . Leukemia Mother    SOCIAL HISTORY:  reports that he quit smoking about 35 years ago.  He has never used smokeless tobacco. He reports that he drinks alcohol. He reports that he does not use illicit drugs.  REVIEW OF SYSTEMS:  As per HPI - All other systems reviewed and were neg.    VITAL SIGNS: Temp:  [98.8 F (37.1 C)-100.2 F (37.9 C)] 99.1 F (37.3 C) (02/18 0845) Pulse Rate:  [91-104] 95 (02/18 0855) Resp:  [16-32] 24 (02/18 0855) BP: (64-113)/(34-70) 93/54 mmHg (02/18 0855) SpO2:  [89 %-100 %] 91 % (02/18 0855) HEMODYNAMICS:   VENTILATOR SETTINGS:   INTAKE / OUTPUT: Intake/Output     02/17 0701 - 02/18 0700 02/18 0701 - 02/19 0700   Blood  12.5   Total Intake   12.5   Net   +12.5          PHYSICAL EXAMINATION: Gen. Pleasant, well-nourished, in no distress, normal affect ENT - no lesions, no post nasal drip, pale , no icterus Neck: No JVD, no thyromegaly, no carotid bruits Lungs: no use of accessory muscles, no dullness to percussion, clear without rales or rhonchi  Cardiovascular: Rhythm regular, heart sounds  normal, no murmurs, no peripheral edema Abdomen: soft and non-tender, no hepatosplenomegaly, BS normal. Musculoskeletal: No deformities, no cyanosis or clubbing Neuro:  alert, non focal Skin:  Warm, no lesions/ rash   LABS:  CBC  Recent Labs Lab 01/06/14 0435  WBC 16.7*  HGB 4.3*  HCT 13.0*  PLT 262   Coag's  Recent Labs Lab 01/06/14 0652  INR 1.32   BMET  Recent Labs Lab 01/06/14 0435  NA 136*  K 3.4*  CL 105  CO2 21  BUN 27*  CREATININE 0.70  GLUCOSE 120*   Electrolytes  Recent Labs Lab 01/06/14 0435  CALCIUM 7.8*   Sepsis Markers No results found for this basename: LATICACIDVEN, PROCALCITON, O2SATVEN,  in the last 168 hours ABG No results found for this basename: PHART, PCO2ART, PO2ART,  in the last 168 hours Liver Enzymes No results found for this basename: AST, ALT, ALKPHOS, BILITOT, ALBUMIN,  in the last 168 hours Cardiac Enzymes  Recent Labs Lab 01/06/14 0716  TROPONINI <0.30   Glucose No  results found for this basename: GLUCAP,  in the last 168 hours  Imaging Dg Chest Port 1 View  01/06/2014   CLINICAL DATA:  Shortness of breath and chest pain  EXAM: PORTABLE CHEST - 1 VIEW  COMPARISON:  Chest CT December 24, 2012  FINDINGS: There is no edema or consolidation. Heart is upper normal in size with normal pulmonary vascularity. No adenopathy. No bone lesions.  IMPRESSION: No edema or consolidation.   Electronically Signed   By: Bretta BangWilliam  Woodruff M.D.   On: 01/06/2014 07:02     ASSESSMENT / PLAN:  Hypotension - r/t hypovolemia, blood loss in setting GIB.  Improved with fluids.  Acute GI bleed - most likely r/t NSAID use.  Suspect upper GIB - favor peptic ulcer by history,Hx ETOH (sober x1 year) so ?varices.  Acute blood loss anemia  PLAN -  Monitor in SDU  Aggressive volume resuscitation - PRBC's pending  GI to see - anticipate EGD protonix gtt  Serial cbc  F/u coags  F/u LFT's Hold home anti-HTN  PCCM will follow, no need for central line at this time. Reassess Hb after transfusion & based on EGD findings  I have personally obtained a history, examined the patient, evaluated laboratory and imaging results, formulated the assessment and plan and placed orders. CRITICAL CARE: The patient is critically ill with multiple organ systems failure and requires high complexity decision making for assessment and support, frequent evaluation and titration of therapies, application of advanced monitoring technologies and extensive interpretation of multiple databases. Critical Care Time devoted to patient care services described in this note is 45 minutes.    Danford BadWHITEHEART,KATHRYN, NP 01/06/2014  9:01 AM Pager: [phone] or [phone]  *Care during the described time interval was provided by me and/or other providers on the critical care team. I have reviewed this patient's available data, including medical history, events of note, physical examination and test results as part  of my evaluation.   Raejean Swinford V.  [phone]

## 2014-01-06 NOTE — ED Provider Notes (Signed)
CSN: [id number]     Arrival date & time 01/06/14  0255 History   First MD Initiated Contact with Patient 01/06/14 [phone]     Chief Complaint  Patient presents with  . GI Bleeding     (Consider location/radiation/quality/duration/timing/severity/associated sxs/prior Treatment) HPI History provided by pt.   Pt developed melena 2 days ago.  Associated w/ lightheadedness, exertional SOB, fatigue and palpitations.  Denies fever, abdominal pain, vomiting, diarrhea.  No other sources of bleeding.  No prior history.  Pt is not anti-coagulated but has been taking motrin and aspirin on an irregular basis for the past month for arthralgias.  Alcoholic but has not had a drink recently.     Past Medical History  Diagnosis Date  . Hypertension   . Arthritis   . Hyperlipidemia    Past Surgical History  Procedure Laterality Date  . Eye surgery     Family History  Problem Relation Age of Onset  . Lymphoma Father   . Leukemia Mother    History  Substance Use Topics  . Smoking status: Former Smoker    Quit date: 12/24/1978  . Smokeless tobacco: Never Used  . Alcohol Use: Yes     Comment: 4 or 5 drinks per night    Review of Systems  All other systems reviewed and are negative.      Allergies  Review of patient's allergies indicates no known allergies.  Home Medications   Current Outpatient Rx  Name  Route  Sig  Dispense  Refill  . aspirin EC 81 MG tablet   Oral   Take 81 mg by mouth daily.         Marland Kitchen atorvastatin (LIPITOR) 10 MG tablet   Oral   Take 10 mg by mouth daily before breakfast.         . ibuprofen (ADVIL,MOTRIN) 200 MG tablet   Oral   Take 600 mg by mouth every 6 (six) hours as needed for headache or moderate pain.         . Multiple Vitamin (MULTIVITAMIN WITH MINERALS) TABS   Oral   Take 1 tablet by mouth daily.         Marland Kitchen NIFEdipine (PROCARDIA XL/ADALAT-CC) 60 MG 24 hr tablet   Oral   Take 60 mg by mouth daily before breakfast.          BP 101/59   Pulse 92  Temp(Src) 98.8 F (37.1 C) (Oral)  Resp 19  SpO2 95% Physical Exam  Nursing note and vitals reviewed. Constitutional: He is oriented to person, place, and time. He appears well-developed and well-nourished. No distress.  HENT:  Head: Normocephalic and atraumatic.  Eyes:  Normal appearance  Neck: Normal range of motion.  Cardiovascular: Normal rate and regular rhythm.   Elevated HR  Pulmonary/Chest: Effort normal and breath sounds normal. No respiratory distress.  Abdominal: Soft. Bowel sounds are normal. He exhibits no distension and no mass. There is no tenderness. There is no rebound and no guarding.  Genitourinary:  Black stool in rectum  Musculoskeletal: Normal range of motion.  Neurological: He is alert and oriented to person, place, and time.  Skin: Skin is warm and dry. No rash noted.  Psychiatric: He has a normal mood and affect. His behavior is normal.    ED Course  Procedures (including critical care time) Labs Review Labs Reviewed  CBC WITH DIFFERENTIAL - Abnormal; Notable for the following:    WBC 16.7 (*)    RBC 1.33 (*)  Hemoglobin 4.3 (*)    HCT 13.0 (*)    Neutro Abs 12.5 (*)    Monocytes Absolute 1.3 (*)    All other components within normal limits  BASIC METABOLIC PANEL - Abnormal; Notable for the following:    Sodium 136 (*)    Potassium 3.4 (*)    Glucose, Bld 120 (*)    BUN 27 (*)    Calcium 7.8 (*)    All other components within normal limits  OCCULT BLOOD, POC DEVICE - Abnormal; Notable for the following:    Fecal Occult Bld POSITIVE (*)    All other components within normal limits  TYPE AND SCREEN  ABO/RH   Imaging Review No results found.  EKG Interpretation   None       MDM   Final diagnoses:  Upper GI bleed  Anemia  Hypotension  CRITICAL CARE Performed by: Ruby ColaSCHINLEVER, Rhian Asebedo E Total critical care time: 30 min Critical care time was exclusive of separately billable procedures and treating other  patients. Critical care was necessary to treat or prevent imminent or life-threatening deterioration. Critical care was time spent personally by me on the following activities: development of treatment plan with patient and/or surrogate as well as nursing, discussions with consultants, evaluation of patient's response to treatment, examination of patient, obtaining history from patient or surrogate, ordering and performing treatments and interventions, ordering and review of laboratory studies, ordering and review of radiographic studies, pulse oximetry and re-evaluation of patient's condition.   68yo healthy M presents w/ melena x 2 days.  Has had associated symptoms concerning for anemia.  On exam, A&O, mildly hypotensive and HR>90, abd benign, black stool in rectal vault.  Hemoccult positive and labs otherwise significant for hgb 4.3 and leukocytosis.  Pt aware of all test results.  Likely has an upper GI bleed secondary to recent NSAID/aspirin use. BP 101/59 in triage and now 91/48 following 1L NS bolus.  Pt otherwise stable. Second line started and pt to receive an other 2L NS as well as protonix.  2 units pRBCs ordered for transfusion and [facility] consulted for admission.   Critical care to see this am. 6:38 AM    Otilio Miuatherine E Sharrieff Spratlin, PA-C 01/06/14 [phone]

## 2014-01-06 NOTE — Consult Note (Signed)
Chart was reviewed and patient was examined.  Full note to follow.  68 year old white male with history of alcohol abuse, hypertension and arthritis admitted for three-day history of melenic stools, shortness of breath and hypotension.  He had been taking NSAIDs regularly for knee pain.  Physical exam is unremarkable  Impression 1.  acute upper GI bleed-most likely NSAID related to bleeding from active peptic ulcer disease.  Doubt variceal bleed 2.  history of alcohol abuse, currently alcohol-free, with hepatic steatosis  Recommendations 1.  complete transfusion of 2 units of packed cells then proceed with upper endoscopy 2.  IV Protonix 3.  hold NSAIDs

## 2014-01-06 NOTE — Op Note (Signed)
[facility] [address] [place], [id number]   ENDOSCOPY PROCEDURE REPORT  PATIENT: Donald Church, Donald Church  MR#: [id number] BIRTHDATE: [birth year] , 67  yrs. old GENDER: Male ENDOSCOPIST: Louis Meckelobert D Cyncere Ruhe, MD REFERRED BY:  Joycelyn RuaStephen Meyers, M.D. PROCEDURE DATE:  01/06/2014 PROCEDURE:  EGD w/ control of bleeding ASA CLASS:     Class III INDICATIONS: MEDICATIONS: These medications were titrated to patient response per physician's verbal order, Versed 2 mg IV, Fentanyl 25 mcg IV, and Benadryl 25 mg IV TOPICAL ANESTHETIC: Cetacaine Spray  DESCRIPTION OF PROCEDURE: After the risks benefits and alternatives of the procedure were thoroughly explained, informed consent was obtained.  The PENTAX GASTOROSCOPE [id number] endoscope was introduced through the mouth and advanced to the third portion of the duodenum. Without limitations.  The instrument was slowly withdrawn as the mucosa was fully examined.      At the beginning of the second portion of the duodenum there was a 1cm ulcer actively bleeding.  Surrounding mucosa was normal. The ulcer was injected with 3cc 1:10,000 epinephrine and 2 endoscopic clips were applied.  Hemostasis was achieved. Fresh blood was pooled along the greater curvature.  .   An early nonobstructing esophageal stricture was seen at the GE junction. The remainder of the upper endoscopy exam was otherwise normal. The scope was then withdrawn from the patient and the procedure completed.  COMPLICATIONS: There were no complications. ENDOSCOPIC IMPRESSION: 1.  bleeding duodenal ulcer - s/p epinephrine injection and clip placement 2.  nonobstructing esophageal stricture  RECOMMENDATIONS: 1. continue PPI drip 2.  check H pylori antibody 3.  clear liquids REPEAT EXAM:  eSigned:  Louis Meckelobert D Keiry Kowal, MD 01/06/2014 2:13 PM   CC:  PATIENT NAME:  Donald Church, Donald Church MR#: [id number]

## 2014-01-06 NOTE — Progress Notes (Signed)
CARE MANAGEMENT NOTE 01/06/2014  Patient:  Rubye OaksWITSCHEN,Sanad   Account Number:  [id number]  Date Initiated:  01/06/2014  Documentation initiated by:  DAVIS,RHONDA  Subjective/Objective Assessment:   fpt with gi bld egd performed and bleeding controlled/hgb down to 5.4/hx of same in the past.     Action/Plan:   home when stable   Anticipated DC Date:  01/09/2014   Anticipated DC Plan:  HOME/SELF CARE  In-house referral  NA      DC Planning Services  NA      [facility]  [facility]   Choice offered to / List presented to:  NA   DME arranged  NA      DME agency  NA     HH arranged  NA      HH agency  NA   Status of service:  In process, will continue to follow Medicare Important Message given?  NA - LOS <3 / Initial given by admissions (If response is "NO", the following Medicare IM given date fields will be blank) Date Medicare IM given:   Date Additional Medicare IM given:    Discharge Disposition:    Per UR Regulation:  Reviewed for med. necessity/level of care/duration of stay  If discussed at Long Length of Stay Meetings, dates discussed:    Comments:  [id number]/Rhonda Stark JockDavis, RN, BSN, [place] [phone] Chart Reviewed for discharge and hospital needs. Discharge needs at time of review:  None present will follow for needs. Review of patient progress due on [id number].

## 2014-01-06 NOTE — Progress Notes (Addendum)
EGD demonstrated a bleeding postbulbar ulcer with visible vessel.  Bleeding successfully treated with epinephrine and endoscopic clips. Begin clear liquids. Check H. Pylori antibody.

## 2014-01-06 NOTE — ED Provider Notes (Signed)
See prior note   Ward GivensIva L Omarri Eich, MD 01/06/14 [phone]

## 2014-01-06 NOTE — ED Provider Notes (Signed)
Patient reports he was having knee pain and around January 23 he started taking 6 aspirin a day with ibuprofen. He states that are 15 he started noticing his bowel movements were black like tar. He denies any abdominal pain. He states he's never had black stools before. He states yesterday he noted with exertion he had shortness of breath and could feel his heart racing. He states he feels fatigued.  Pleasant gentleman he is alert and cooperative. His conjunctiva are pale. His palms are very pale. His abdomen soft and nontender.  Medical screening examination/treatment/procedure(s) were conducted as a shared visit with non-physician practitioner(s) and myself.  I personally evaluated the patient during the encounter.  EKG Interpretation   None        Devoria AlbeIva Demari Gales, MD, Armando GangFACEP   Ward GivensIva L Indianna Boran, MD 01/06/14 [phone]

## 2014-01-06 NOTE — H&P (Signed)
[facility] History and Physical  Donald Church ZOX:[id number] DOB: February 04, 1946 DOA: 01/06/2014  Referring physician: ER physician. PCP: Joycelyn Rua, MD   Chief Complaint: Feeling weak and black stools.  HPI: Donald Church is a 68 y.o. male with history of hypertension and hyperlipidemia who has been taking NSAIDs for knee pain and previous history of alcohol abuse quit one year ago started experiencing shortness of breath and palpitations off and on over the last 2 days. Patient started noticing black stools over the last 3 days. Denies any abdominal pain nausea vomiting diarrhea. Denies any chest pain though he has had palpitations. In the ER patient's hemoglobin is found to be around 4 and last year around this time patient's hemoglobin is around 13. In the ER patient was found to be mildly hypotensive and was given 3 L normal saline bolus. Patient has been type and screen and PRBCs has been ordered for transfusion. Critical care was consulted and they'll be seeing patient in consult. In addition patient's labs show leukocytosis. Patient will be admitted for further management. Patient denies having had any previous episodes of GI bleed.   Review of Systems: As presented in the history of presenting illness, rest negative.  Past Medical History  Diagnosis Date  . Hypertension   . Arthritis   . Hyperlipidemia    Past Surgical History  Procedure Laterality Date  . Eye surgery     Social History:  reports that he quit smoking about 35 years ago. He has never used smokeless tobacco. He reports that he drinks alcohol. He reports that he does not use illicit drugs. Where does patient live home. Can patient participate in ADLs? Yes.  No Known Allergies  Family History:  Family History  Problem Relation Age of Onset  . Lymphoma Father   . Leukemia Mother       Prior to Admission medications   Medication Sig Start Date End Date Taking? Authorizing Provider  aspirin EC 81  MG tablet Take 81 mg by mouth daily.   Yes Historical Provider, MD  atorvastatin (LIPITOR) 10 MG tablet Take 10 mg by mouth daily before breakfast.   Yes Historical Provider, MD  ibuprofen (ADVIL,MOTRIN) 200 MG tablet Take 600 mg by mouth every 6 (six) hours as needed for headache or moderate pain.   Yes Historical Provider, MD  Multiple Vitamin (MULTIVITAMIN WITH MINERALS) TABS Take 1 tablet by mouth daily.   Yes Historical Provider, MD  NIFEdipine (PROCARDIA XL/ADALAT-CC) 60 MG 24 hr tablet Take 60 mg by mouth daily before breakfast.   Yes Historical Provider, MD    Physical Exam: Filed Vitals:   01/06/14 0330 01/06/14 0400 01/06/14 0430 01/06/14 0549  BP:    96/53  Pulse: 97 96 92 104  Temp:    99.3 F (37.4 C)  TempSrc:    Oral  Resp: 16 22 19 19   SpO2: 96% 97% 95% 100%     General:  Well-developed and nourished.  Eyes: Anicteric. Pallor present.  ENT: No discharge from the ears eyes nose mouth.  Neck: No mass felt.  Cardiovascular: S1-S2 heard.  Respiratory: No rhonchi or crepitations.  Abdomen: Soft nontender bowel sounds present. No guarding rigidity.  Skin: No rash.  Musculoskeletal: No edema.  Psychiatric: Appears normal.  Neurologic: Alert awake oriented to time place and person. Moves all extremities.  Labs on Admission:  Basic Metabolic Panel:  Recent Labs Lab 01/06/14 0435  NA 136*  K 3.4*  CL 105  CO2 21  GLUCOSE  120*  BUN 27*  CREATININE 0.70  CALCIUM 7.8*   Liver Function Tests: No results found for this basename: AST, ALT, ALKPHOS, BILITOT, PROT, ALBUMIN,  in the last 168 hours No results found for this basename: LIPASE, AMYLASE,  in the last 168 hours No results found for this basename: AMMONIA,  in the last 168 hours CBC:  Recent Labs Lab 01/06/14 0435  WBC 16.7*  NEUTROABS 12.5*  HGB 4.3*  HCT 13.0*  MCV 97.7  PLT 262   Cardiac Enzymes: No results found for this basename: CKTOTAL, CKMB, CKMBINDEX, TROPONINI,  in the last  168 hours  BNP (last 3 results) No results found for this basename: PROBNP,  in the last 8760 hours CBG: No results found for this basename: GLUCAP,  in the last 168 hours  Radiological Exams on Admission: No results found.   Assessment/Plan Principal Problem:   Upper GI bleed Active Problems:   Hypertension   Anemia   Leucocytosis   HTN (hypertension)   Acute GI bleeding   #1. Acute GI bleed - most likely upper GI bleed given the melanotic stools and NSAIDs use. Patient also has had previous history of alcohol abuse so possibility of variceal bleed is in the differentials. At this time 2 units of packed red blood cell transfusion has been ordered. I have placed patient on Protonix infusion. Empiric antibiotics for possible variceal bleed. Check INR. Discussed with on-call gastroenterologist Dr. Russella DarStark of about GI. Patient will be kept n.p.o. for possible EGD. Discontinue NSAIDs and aspirin. #2. Acute blood loss anemia - closely follow CBC. Patient may need further transfusion. #3. History of hypertension - since patient is hypotensive antihypertensives on hold. #4. History of hyperlipidemia - continue present medications once patient can take orally. #5. Previous history of alcohol abuse quit one year ago. #6. Leukocytosis - probably reactionary but given history of alcoholism previously and possible variceal bleed I have placed patient on empiric antibiotics. Chest x-ray is pending.  Patient's chest x-ray EKG and INR are pending. I have discussed with on-call gastroenterologist Dr. Russella DarStark. Critical care was also consulted.  Code Status:  full code.   Family Communication:  patient's wife at the bedside.  Disposition Plan:  admit to inpatient.     [name] N. [facility] Pager [phone].   If 7PM-7AM, please contact night-coverage [email or web address] Password [email or web address] 01/06/2014, 6:47 AM

## 2014-01-06 NOTE — ED Provider Notes (Signed)
EKG Interpretation    Date/Time:  Wednesday January 06 2014 06:35:07 EST Ventricular Rate:  94 PR Interval:  153 QRS Duration: 94 QT Interval:  415 QTC Calculation: 519 R Axis:   68 Text Interpretation:  Sinus rhythm Since last tracing Repol abnrm suggests ischemia, diffuse leads Prolonged QT interval Confirmed by Jujuan Dugo  MD-I, Jennelle Pinkstaff ([id number]) on 01/06/2014 7:02:02 AM           Troponin added to his labs. Pt is being transfused which should help with his apparent ischemia from his severe anemia. Pt has had DOE and palpitations without chest pain.    Devoria AlbeIva Tyrees Chopin, MD, FACEP   Ward GivensIva L Shyniece Scripter, MD 01/06/14 [phone]

## 2014-01-06 NOTE — Consult Note (Signed)
Consultation  Referring Provider: [facility]     Primary Care Physician:  Joycelyn RuaMEYERS, STEPHEN, MD Primary Gastroenterologist:   Sheryn Bisonavid Patterson, MD      Reason for Consultation:  GI bleed         HPI:   Donald Church is a 68 y.o. male who presented to ED with 3 days of dark stools and dyspnea. In ED he was tachcardia,, SBP in high 90's and had a low grade temp.  Hgb 4.3, down from baseline of around 14. BUN is 27. INR 1.32. Patient takes several anti-inflammatories a day ( ASA, Motrin) for knee pain. No vomiting. No abdominal pain. Patient transfused 1 of 2 units PRBCs. Started on Protonix drip.   Patient has a history of ETOH abuse though no longer drinks. CT angio of chest shows marked fatty liver, limited study but no PE seen. Abd ultrasound confirms fatty liver, no ascites. He drank "heavily" for many years. AST/ ALT = 52/42.    Past Medical History  Diagnosis Date  . Hypertension   . Arthritis   . Hyperlipidemia     Past Surgical History  Procedure Laterality Date  . Eye surgery      Family History  Problem Relation Age of Onset  . Lymphoma Father   . Leukemia Mother    No FMH of liver disease, GI malignancy  History  Substance Use Topics  . Smoking status: Former Smoker    Quit date: 12/24/1978  . Smokeless tobacco: Never Used  . Alcohol Use: Yes     Comment: 4 or 5 drinks per night    Prior to Admission medications   Medication Sig Start Date End Date Taking? Authorizing Provider  aspirin EC 81 MG tablet Take 81 mg by mouth daily.   Yes Historical Provider, MD  atorvastatin (LIPITOR) 10 MG tablet Take 10 mg by mouth daily before breakfast.   Yes Historical Provider, MD  ibuprofen (ADVIL,MOTRIN) 200 MG tablet Take 600 mg by mouth every 6 (six) hours as needed for headache or moderate pain.   Yes Historical Provider, MD  Multiple Vitamin (MULTIVITAMIN WITH MINERALS) TABS Take 1 tablet by mouth daily.   Yes Historical Provider, MD  NIFEdipine (PROCARDIA  XL/ADALAT-CC) 60 MG 24 hr tablet Take 60 mg by mouth daily before breakfast.   Yes Historical Provider, MD    Current Facility-Administered Medications  Medication Dose Route Frequency Provider Last Rate Last Dose  . 0.9 %  sodium chloride infusion   Intravenous Continuous Eduard ClosArshad N Kakrakandy, MD      . acetaminophen (TYLENOL) tablet 650 mg  650 mg Oral Q6H PRN Eduard ClosArshad N Kakrakandy, MD       Or  . acetaminophen (TYLENOL) suppository 650 mg  650 mg Rectal Q6H PRN Eduard ClosArshad N Kakrakandy, MD      . ceFEPIme (MAXIPIME) 1 g in dextrose 5 % 50 mL IVPB  1 g Intravenous Q12H Eduard ClosArshad N Kakrakandy, MD      . ondansetron [facility](ZOFRAN) tablet 4 mg  4 mg Oral Q6H PRN Eduard ClosArshad N Kakrakandy, MD       Or  . ondansetron [facility](ZOFRAN) injection 4 mg  4 mg Intravenous Q6H PRN Eduard ClosArshad N Kakrakandy, MD      . pantoprazole (PROTONIX) 80 mg in sodium chloride 0.9 % 250 mL infusion  8 mg/hr Intravenous Continuous Eduard ClosArshad N Kakrakandy, MD 25 mL/hr at 01/06/14 0649 8 mg/hr at 01/06/14 0649  . sodium chloride 0.9 % injection 3 mL  3 mL  Intravenous Q12H Eduard Clos, MD        Allergies as of 01/06/2014  . (No Known Allergies)    Review of Systems:    All systems reviewed and negative except where noted in HPI.   Physical Exam:  Vital signs in last 24 hours: Temp:  [98.8 F (37.1 C)-100.2 F (37.9 C)] 99.3 F (37.4 C) (02/18 0905) Pulse Rate:  [89-104] 89 (02/18 1000) Resp:  [16-32] 18 (02/18 1000) BP: (64-113)/(34-70) 89/58 mmHg (02/18 1000) SpO2:  [89 %-100 %] 96 % (02/18 1000)   General:   Pleasant white male in NAD Head:  Normocephalic and atraumatic. Eyes:   No icterus.   Conjunctiva pink. Ears:  Normal auditory acuity. Neck:  Supple; no masses felt Lungs:  Respirations even and unlabored. Lungs clear to auscultation bilaterally.   No wheezes, crackles, or rhonchi.  Heart:  Regular rate and rhythm;  murmur heard. Abdomen:  Soft, nondistended, nontender. Normal bowel sounds. No appreciable masses or  hepatomegaly.  Msk:  Symmetrical without gross deformities.  Extremities:  Without edema. Neurologic:  Alert and  oriented x4;  grossly normal neurologically. Skin:  Intact without significant lesions or rashes. Cervical Nodes:  No significant cervical adenopathy. Psych:  Alert and cooperative. Normal affect.  LAB RESULTS:  Recent Labs  01/06/14 0435  WBC 16.7*  HGB 4.3*  HCT 13.0*  PLT 262   BMET  Recent Labs  01/06/14 0435  NA 136*  K 3.4*  CL 105  CO2 21  GLUCOSE 120*  BUN 27*  CREATININE 0.70  CALCIUM 7.8*   PT/INR  Recent Labs  01/06/14 0652  LABPROT 16.1*  INR 1.32    STUDIES: Dg Chest Port 1 View  01/06/2014   CLINICAL DATA:  Shortness of breath and chest pain  EXAM: PORTABLE CHEST - 1 VIEW  COMPARISON:  Chest CT December 24, 2012  FINDINGS: There is no edema or consolidation. Heart is upper normal in size with normal pulmonary vascularity. No adenopathy. No bone lesions.  IMPRESSION: No edema or consolidation.   Electronically Signed   By: Bretta Bang M.D.   On: 01/06/2014 07:02    PREVIOUS ENDOSCOPIES:            Screening colonoscopy Aug 2010 ENDOSCOPIC IMPRESSION:  1) Diminutive polyp  2) Otherwise normal examination  RECOMMENDATIONS:  1) Continue current colorectal screening recommendations for "routine risk" patients with a repeat colonoscopy in 10 years.     Impression / Plan:   42. 68 year old male with GI bleed, presumably upper with dark stools / elevated BUN. Bleeding in setting of NSAID use. Rule out PUD. Patient for EGD this afternoon. The benefits, risks, and potential complications of EGD with possible biopsies were discussed with the patient and he agrees to proceed.   2. Profound normocytic anemia secondary to #1. Hgb 4.3. He is getting 2/2 units of blood now. Will likely need iron supplements at discharge.   3. Mild hypotension / tachycardia. IVF to resume at 100cc/hr after blood transfused.  4. Normal screening colonoscopy  2010.   5. Fatty liver disease by ultrasound. No stigmata of cirrhosis on exam.    Thanks   LOS: 0 days   Willette Cluster  01/06/2014, 10:11 AM  GI Attending Note   Chart was reviewed and patient was examined. X-rays and lab were reviewed.    I agree with management and plans.  Barbette Hair. Arlyce Dice, M.D., [facility] Cell [phone]

## 2014-01-06 NOTE — ED Notes (Signed)
Per EMS pt from home, started having dark tarry stools Sunday night, Monday night noticed was pale, has had dizziness/shortness of breath since, tonight started having palpitations, denies chest pain, denies pain anywhere, orthostatic changes, BP 104/60, HR 100 irregular, 100% RA, 16RR.

## 2014-01-06 NOTE — ED Notes (Signed)
Pt states Sunday started having dark, tarry stools, states has been taking motrin and aspirin more frequently for knee pain lately, after the tarry stools started has been dizzy, having heart palpitations, and shortness of breath, denies n/v/d, denies blurred vision or chest pain.

## 2014-01-07 ENCOUNTER — Encounter (HOSPITAL_COMMUNITY): Payer: Self-pay | Admitting: Gastroenterology

## 2014-01-07 DIAGNOSIS — K264 Chronic or unspecified duodenal ulcer with hemorrhage: Principal | ICD-10-CM

## 2014-01-07 LAB — TYPE AND SCREEN
ABO/RH(D): O POS
ANTIBODY SCREEN: NEGATIVE
UNIT DIVISION: 0
UNIT DIVISION: 0
UNIT DIVISION: 0
Unit division: 0

## 2014-01-07 LAB — BASIC METABOLIC PANEL
BUN: 9 mg/dL (ref 6–23)
CALCIUM: 7.1 mg/dL — AB (ref 8.4–10.5)
CO2: 21 mEq/L (ref 19–32)
Chloride: 110 mEq/L (ref 96–112)
Creatinine, Ser: 0.64 mg/dL (ref 0.50–1.35)
GFR calc non Af Amer: >90 mL/min (ref >90)
Glucose, Bld: 111 mg/dL — ABNORMAL HIGH (ref 70–99)
Potassium: 3 mEq/L — ABNORMAL LOW (ref 3.7–5.3)
Sodium: 142 mEq/L (ref 137–147)

## 2014-01-07 LAB — GLUCOSE, CAPILLARY
GLUCOSE-CAPILLARY: 107 mg/dL — AB (ref 70–99)
GLUCOSE-CAPILLARY: 125 mg/dL — AB (ref 70–99)
GLUCOSE-CAPILLARY: 110 mg/dL — AB (ref 70–99)
Glucose-Capillary: 113 mg/dL — ABNORMAL HIGH (ref 70–99)
Glucose-Capillary: 94 mg/dL (ref 70–99)
Glucose-Capillary: 99 mg/dL (ref 70–99)

## 2014-01-07 LAB — CBC
HCT: 22.1 % — ABNORMAL LOW (ref 39.0–52.0)
Hemoglobin: 7.5 g/dL — ABNORMAL LOW (ref 13.0–17.0)
MCH: 30.6 pg (ref 26.0–34.0)
MCHC: 33.9 g/dL (ref 30.0–36.0)
MCV: 90.2 fL (ref 78.0–100.0)
Platelets: 237 10*3/uL (ref 150–400)
RBC: 2.45 MIL/uL — ABNORMAL LOW (ref 4.22–5.81)
RDW: 16.7 % — AB (ref 11.5–15.5)
WBC: 14 10*3/uL — AB (ref 4.0–10.5)

## 2014-01-07 MED ORDER — POTASSIUM CHLORIDE CRYS ER 20 MEQ PO TBCR
40.0000 meq | EXTENDED_RELEASE_TABLET | Freq: Once | ORAL | Status: AC
  Administered 2014-01-07: 40 meq via ORAL
  Filled 2014-01-07: qty 2

## 2014-01-07 NOTE — Progress Notes (Signed)
Name: Donald OaksSteven Church MRN: [id number] DOB: 08-26-1946    ADMISSION DATE:  01/06/2014 CONSULTATION DATE:  01/06/14    REFERRING MD :  [facility] PRIMARY SERVICE: [facility]  CHIEF COMPLAINT:  Hypotension, GIB   BRIEF PATIENT DESCRIPTION: 68yo male with hx HTN, previous hx ETOH quit one year ago. Has been taking NSAIDs for knee pain presented 2/18 with 3 day hx black tarry stools.  In ER hypotensive with Hgb 4 and PCCM consulted.   SIGNIFICANT EVENTS / STUDIES:  EGD 2/18>>>bleeding duodenal ulcer - s/p epinephrine injection and clip placement  SUBJ - afebrile, feels much improved Denies abd pain, malena  VITAL SIGNS: Temp:  [98 F (36.7 C)-99.3 F (37.4 C)] 98 F (36.7 C) (02/19 0800) Pulse Rate:  [61-115] 80 (02/19 1100) Resp:  [14-31] 20 (02/19 1100) BP: (44-130)/(27-93) 113/72 mmHg (02/19 1000) SpO2:  [90 %-100 %] 100 % (02/19 1100) HEMODYNAMICS:   VENTILATOR SETTINGS:   INTAKE / OUTPUT: Intake/Output     02/18 0701 - 02/19 0700 02/19 0701 - 02/20 0700   I.V. (mL/kg) 2750 (34.4) 250 (3.1)   Blood 669.5    IV Piggyback 100    Total Intake(mL/kg) 3519.5 (44) 250 (3.1)   Urine (mL/kg/hr) 5075 (2.6)    Total Output 5075     Net -1555.5 +250          PHYSICAL EXAMINATION: Gen. Pleasant, well-nourished, in no distress, normal affect ENT - no lesions, no post nasal drip, pale , no icterus Neck: No JVD, no thyromegaly, no carotid bruits Lungs: no use of accessory muscles, no dullness to percussion, clear without rales or rhonchi  Cardiovascular: Rhythm regular, heart sounds  normal, no murmurs, no peripheral edema Abdomen: soft and non-tender, no hepatosplenomegaly, BS normal. Musculoskeletal: No deformities, no cyanosis or clubbing Neuro:  alert, non focal Skin:  Warm, no lesions/ rash   LABS:  CBC  Recent Labs Lab 01/06/14 0435 01/06/14 1400 01/07/14 0320  WBC 16.7* 14.6* 14.0*  HGB 4.3* 5.6* 7.5*  HCT 13.0* 16.7* 22.1*  PLT 262 225 237   Coag's  Recent  Labs Lab 01/06/14 0652  INR 1.32   BMET  Recent Labs Lab 01/06/14 0435 01/06/14 1400 01/07/14 0320  NA 136* 140 142  K 3.4* 3.0* 3.0*  CL 105 111 110  CO2 21 18* 21  BUN 27* 17 9  CREATININE 0.70 0.65 0.64  GLUCOSE 120* 118* 111*   Electrolytes  Recent Labs Lab 01/06/14 0435 01/06/14 1400 01/07/14 0320  CALCIUM 7.8* 6.9* 7.1*   Sepsis Markers No results found for this basename: LATICACIDVEN, PROCALCITON, O2SATVEN,  in the last 168 hours ABG No results found for this basename: PHART, PCO2ART, PO2ART,  in the last 168 hours Liver Enzymes  Recent Labs Lab 01/06/14 1400  AST 16  ALT 9  ALKPHOS 26*  BILITOT <0.2*  ALBUMIN 2.4*   Cardiac Enzymes  Recent Labs Lab 01/06/14 0716  TROPONINI <0.30   Glucose  Recent Labs Lab 01/06/14 2036 01/07/14 0351 01/07/14 0750  GLUCAP 115* 107* 113*    Imaging Dg Chest Port 1 View  01/06/2014   CLINICAL DATA:  Shortness of breath and chest pain  EXAM: PORTABLE CHEST - 1 VIEW  COMPARISON:  Chest CT December 24, 2012  FINDINGS: There is no edema or consolidation. Heart is upper normal in size with normal pulmonary vascularity. No adenopathy. No bone lesions.  IMPRESSION: No edema or consolidation.   Electronically Signed   By: Bretta BangWilliam  Woodruff M.D.  On: 01/06/2014 07:02     ASSESSMENT / PLAN:  Hypotension - r/t hypovolemia, blood loss in setting GIB.  Improved with fluids.  Acute GI bleed - bleeding duodenal ulcer - s/p epinephrine injection and clip  Placement, most likely r/t NSAID use.  Hx ETOH (sober x1 year) so ?varices.  Acute blood loss anemia  PLAN -   protonix gtt  Hold home anti-HTN  PCCM to sign off, can transfer to floor, Hold off further transfusions unless Hb <7 Agree with iron supplements, will need alternative pain meds for knee (tramadol, tylenol #3)   Gracelee Stemmler V.  [phone]

## 2014-01-07 NOTE — Progress Notes (Signed)
[facility] PROGRESS NOTE  Donald Church ZOX:[id number] DOB: 24-Jul-1946 DOA: 01/06/2014 PCP: Joycelyn Rua, MD  Assessment/Plan: #1. Acute GI bleed -  S/p egd revealing bleeding duodenal ulcer s/p clipping and epi. Additional 2 units prbc's tx yesterday for hgb just over 5 with appropriate correction. Gi following. Will follow recs. #2. Acute blood loss anemia - closely follow CBC and transfuse as needed #3. History of hypertension - since patient was hypotensive, antihypertensives cont to be on hold.  #4. History of hyperlipidemia - continue present medications once patient can take orally.  #5. Previous history of alcohol abuse quit one year ago.  #6. Leukocytosis - probably reactionary but given history of alcoholism previously and possible variceal bleed was placed empirically on antibiotics. Chest x-ray neg. Afebrile. Consider d/c abx  Code Status: Full Family Communication: Pt in room (indicate person spoken with, relationship, and if by phone, the number) Disposition Plan: Pending   Consultants:  GI  Procedures:  EGD 01/06/14  Antibiotics:  Cefepime 01/05/14>>>  HPI/Subjective: No acute events noted overnight  Objective: Filed Vitals:   01/07/14 0400 01/07/14 0500 01/07/14 0600 01/07/14 0610  BP: 91/63  80/49 119/80  Pulse: 79 72 61 127  Temp: 98.7 F (37.1 C)     TempSrc: Oral     Resp: 18 17 16 26   Height:      Weight:      SpO2: 94% 91% 92% 90%    Intake/Output Summary (Last 24 hours) at 01/07/14 0804 Last data filed at 01/07/14 0600  Gross per 24 hour  Intake 3394.5 ml  Output   5075 ml  Net -1680.5 ml   Filed Weights   01/06/14 1000  Weight: 79.9 kg (176 lb 2.4 oz)    Exam:   General:  Awake, in nad  Cardiovascular: regular, s1, s2  Respiratory: normal resp effort, no wheezing  Abdomen: soft, nondistended  Musculoskeletal: perfused, no clubbing   Data Reviewed: Basic Metabolic Panel:  Recent Labs Lab 01/06/14 0435  01/06/14 1400 01/07/14 0320  NA 136* 140 142  K 3.4* 3.0* 3.0*  CL 105 111 110  CO2 21 18* 21  GLUCOSE 120* 118* 111*  BUN 27* 17 9  CREATININE 0.70 0.65 0.64  CALCIUM 7.8* 6.9* 7.1*   Liver Function Tests:  Recent Labs Lab 01/06/14 1400  AST 16  ALT 9  ALKPHOS 26*  BILITOT <0.2*  PROT 4.3*  ALBUMIN 2.4*   No results found for this basename: LIPASE, AMYLASE,  in the last 168 hours No results found for this basename: AMMONIA,  in the last 168 hours CBC:  Recent Labs Lab 01/06/14 0435 01/06/14 1400 01/07/14 0320  WBC 16.7* 14.6* 14.0*  NEUTROABS 12.5* 11.1*  --   HGB 4.3* 5.6* 7.5*  HCT 13.0* 16.7* 22.1*  MCV 97.7 94.4 90.2  PLT 262 225 237   Cardiac Enzymes:  Recent Labs Lab 01/06/14 0716  TROPONINI <0.30   BNP (last 3 results) No results found for this basename: PROBNP,  in the last 8760 hours CBG:  Recent Labs Lab 01/06/14 2036 01/07/14 0351  GLUCAP 115* 107*    Recent Results (from the past 240 hour(s))  MRSA PCR SCREENING     Status: None   Collection Time    01/06/14  9:36 AM      Result Value Ref Range Status   MRSA by PCR NEGATIVE  NEGATIVE Final   Comment:            The GeneXpert MRSA Assay (  FDA     approved for NASAL specimens     only), is one component of a     comprehensive MRSA colonization     surveillance program. It is not     intended to diagnose MRSA     infection nor to guide or     monitor treatment for     MRSA infections.     Studies: Dg Chest Port 1 View  01/06/2014   CLINICAL DATA:  Shortness of breath and chest pain  EXAM: PORTABLE CHEST - 1 VIEW  COMPARISON:  Chest CT December 24, 2012  FINDINGS: There is no edema or consolidation. Heart is upper normal in size with normal pulmonary vascularity. No adenopathy. No bone lesions.  IMPRESSION: No edema or consolidation.   Electronically Signed   By: Bretta BangWilliam  Woodruff M.D.   On: 01/06/2014 07:02    Scheduled Meds: . antiseptic oral rinse  15 mL Mouth Rinse q12n4p   . ceFEPime (MAXIPIME) IV  1 g Intravenous Q12H  . chlorhexidine  15 mL Mouth Rinse BID  . sodium chloride  3 mL Intravenous Q12H   Continuous Infusions: . sodium chloride 100 mL/hr at 01/07/14 0452  . pantoprozole (PROTONIX) infusion 8 mg/hr (01/06/14 2133)    Principal Problem:   Upper GI bleed Active Problems:   Hypertension   Anemia   Leucocytosis   HTN (hypertension)   Acute GI bleeding   Duodenal ulcer hemorrhage  Time spent: 35min  Eder Macek K  [facility] Pager [phone]. If 7PM-7AM, please contact night-coverage at [email or web address], password [email or web address] 01/07/2014, 8:04 AM  LOS: 1 day

## 2014-01-07 NOTE — Progress Notes (Signed)
[id number]/Philamena Kramar Earlene Plateravis, RN, BSN, CCM, [phone] Chart reviewed for update of needs and condition. diet being advanced, hgb 7.5, no noted bleeding, to transfer to floor [id number].

## 2014-01-07 NOTE — Progress Notes (Signed)
    Progress Note   Subjective  feels fine, no further bleeding. Hungry   Objective   Vital signs in last 24 hours: Temp:  [98 F (36.7 C)-99.3 F (37.4 C)] 98 F (36.7 C) (02/19 0800) Pulse Rate: 60's Resp:  [14-31] 26 (02/19 0610) BP: (44-130)/(27-93) 119/80 mmHg (02/19 0610) SpO2:  [90 %-100 %] 90 % (02/19 0610) Weight:  [176 lb 2.4 oz (79.9 kg)] 176 lb 2.4 oz (79.9 kg) (02/18 1000)   General:    Pleasant white male in NAD Heart:  Regular rate and rhythm Abdomen:  Soft, nontender and nondistended. Normal bowel sounds. Extremities:  Without edema. Neurologic:  Alert and oriented,  grossly normal neurologically. Psych:  Cooperative. Normal mood and affect.    Lab Results:  Recent Labs  01/06/14 0435 01/06/14 1400 01/07/14 0320  WBC 16.7* 14.6* 14.0*  HGB 4.3* 5.6* 7.5*  HCT 13.0* 16.7* 22.1*  PLT 262 225 237   BMET  Recent Labs  01/06/14 0435 01/06/14 1400 01/07/14 0320  NA 136* 140 142  K 3.4* 3.0* 3.0*  CL 105 111 110  CO2 21 18* 21  GLUCOSE 120* 118* 111*  BUN 27* 17 9  CREATININE 0.70 0.65 0.64  CALCIUM 7.8* 6.9* 7.1*   LFT  Recent Labs  01/06/14 1400  PROT 4.3*  ALBUMIN 2.4*  AST 16  ALT 9  ALKPHOS 26*  BILITOT <0.2*   EGD ENDOSCOPIC IMPRESSION:  1. bleeding duodenal ulcer - s/p epinephrine injection and clip  placement  2. nonobstructing esophageal stricture  RECOMMENDATIONS:  1. continue PPI drip  2. check H pylori antibody  3. clear liquids   Assessment / Plan:   1. Upper GI bleed secondary to duodenal ulcer, s/p EGD with control of bleeding yesterday. Ulcer probably NSAID related but H.pylori pending. Continue PPI drip through today, change to PO BID tomorrow. Avoid NSAIDs at home. He will need alternative treatment for knee pain. Will see again tomorrow. Advance to full liquids  2.Hypokalemia. Will give [id number] now and again this pm. Recheck in am.   3. Anemia of acute blood loss. Hgb up about 3gms post 4 units of blood  (4.3>>>7.5). Should start oral iron upon discharge.   LOS: 1 day   Willette Clusteraula Guenther  01/07/2014, 9:38 AM  GI Attending Note  I have personally taken an interval history, reviewed the chart, and examined the patient.  I agree with the extender's note, impression and recommendations.  Barbette Hairobert D. Arlyce DiceKaplan, MD, [facility] [place] [facility] [phone]

## 2014-01-08 DIAGNOSIS — K219 Gastro-esophageal reflux disease without esophagitis: Secondary | ICD-10-CM

## 2014-01-08 LAB — CBC
HEMATOCRIT: 24.1 % — AB (ref 39.0–52.0)
Hemoglobin: 8 g/dL — ABNORMAL LOW (ref 13.0–17.0)
MCH: 30.4 pg (ref 26.0–34.0)
MCHC: 33.2 g/dL (ref 30.0–36.0)
MCV: 91.6 fL (ref 78.0–100.0)
Platelets: 287 10*3/uL (ref 150–400)
RBC: 2.63 MIL/uL — ABNORMAL LOW (ref 4.22–5.81)
RDW: 19.6 % — AB (ref 11.5–15.5)
WBC: 9.8 10*3/uL (ref 4.0–10.5)

## 2014-01-08 LAB — BASIC METABOLIC PANEL
BUN: 8 mg/dL (ref 6–23)
CALCIUM: 7.8 mg/dL — AB (ref 8.4–10.5)
CO2: 22 mEq/L (ref 19–32)
CREATININE: 0.7 mg/dL (ref 0.50–1.35)
Chloride: 109 mEq/L (ref 96–112)
GFR calc non Af Amer: >90 mL/min (ref >90)
Glucose, Bld: 106 mg/dL — ABNORMAL HIGH (ref 70–99)
Potassium: 3.6 mEq/L — ABNORMAL LOW (ref 3.7–5.3)
Sodium: 140 mEq/L (ref 137–147)

## 2014-01-08 LAB — GLUCOSE, CAPILLARY
GLUCOSE-CAPILLARY: 99 mg/dL (ref 70–99)
GLUCOSE-CAPILLARY: 88 mg/dL (ref 70–99)
Glucose-Capillary: 103 mg/dL — ABNORMAL HIGH (ref 70–99)

## 2014-01-08 LAB — H. PYLORI ANTIBODY, IGG

## 2014-01-08 MED ORDER — FERROUS SULFATE 325 (65 FE) MG PO TABS
325.0000 mg | ORAL_TABLET | Freq: Every day | ORAL | Status: DC
  Administered 2014-01-08: 325 mg via ORAL
  Filled 2014-01-08 (×2): qty 1

## 2014-01-08 MED ORDER — PANTOPRAZOLE SODIUM 40 MG PO TBEC
40.0000 mg | DELAYED_RELEASE_TABLET | Freq: Two times a day (BID) | ORAL | Status: DC
  Administered 2014-01-08: 40 mg via ORAL
  Filled 2014-01-08: qty 1

## 2014-01-08 MED ORDER — FERROUS SULFATE 325 (65 FE) MG PO TABS: 325.0000 mg | ORAL_TABLET | Freq: Every day | ORAL | Status: AC

## 2014-01-08 MED ORDER — PANTOPRAZOLE SODIUM 40 MG PO TBEC: 40.0000 mg | DELAYED_RELEASE_TABLET | Freq: Two times a day (BID) | ORAL | Status: DC

## 2014-01-08 NOTE — Progress Notes (Signed)
    Progress Note   Subjective  feels fine, no further bleeding, eating solids   Objective   Vital signs in last 24 hours: Temp:  [97.9 F (36.6 C)-98.7 F (37.1 C)] 98.2 F (36.8 C) (02/20 0400) Pulse Rate:  [51-90] 64 (02/20 0800) Resp:  [15-24] 18 (02/20 0800) BP: (94-118)/(50-72) 108/57 mmHg (02/20 0800) SpO2:  [93 %-100 %] 97 % (02/20 0800) Last BM Date: 01/06/14 General:    pleasant white male in NAD  Heart:  Regular rate and rhythm Abdomen:  Soft, nontender and nondistended. Normal bowel sounds. Extremities:  Without edema. Neurologic:  Alert and oriented,  grossly normal neurologically. Psych:  Cooperative. Normal mood and affect.  Lab Results:  Recent Labs  01/06/14 1400 01/07/14 0320 01/08/14 0325  WBC 14.6* 14.0* 9.8  HGB 5.6* 7.5* 8.0*  HCT 16.7* 22.1* 24.1*  PLT 225 237 287   BMET  Recent Labs  01/06/14 1400 01/07/14 0320 01/08/14 0325  NA 140 142 140  K 3.0* 3.0* 3.6*  CL 111 110 109  CO2 18* 21 22  GLUCOSE 118* 111* 106*  BUN 17 9 8   CREATININE 0.65 0.64 0.70  CALCIUM 6.9* 7.1* 7.8*   LFT  Recent Labs  01/06/14 1400  PROT 4.3*  ALBUMIN 2.4*  AST 16  ALT 9  ALKPHOS 26*  BILITOT <0.2*     Assessment / Plan:    1. Upper GI bleed secondary to duodenal ulcer, s/p EGD with control of bleeding. Ulcer probably NSAID, H.pylori negative. Avoid NSAIDs at home. He will need alternative treatment for knee pain.  Advance to solids,  PPI already changed to PO. Home on BID PPI, he is stable for discharge from GI standpoint. Our office will call him with a follow up to see me.   2.Hypokalemia, improved after replacement 3.0 >>>3.6.    3. Anemia of acute blood loss. Hgb up from 4.3 to 8.0 post transfusion.  Should start oral iron upon discharge.     LOS: 2 days   Willette Clusteraula Guenther  01/08/2014, 9:01 AM  GI Attending Note  I have personally taken an interval history, reviewed the chart, and examined the patient.  I agree with the extender's  note, impression and recommendations. He should have 2 week course of BID PPI Rx, then can lower to QD.  Barbette Hairobert D. Arlyce DiceKaplan, MD, [facility] [place] [facility] [phone]

## 2014-01-08 NOTE — Discharge Summary (Signed)
Physician Discharge Summary  [name] ZOX:[id number] DOB: 08/21/1946 DOA: 01/06/2014  PCP: Joycelyn Rua, MD  Admit date: 01/06/2014 Discharge date: 01/08/2014  Time spent: 35 minutes  Recommendations for Outpatient Follow-up:  1. Follow up with PCP in 1-2 weeks 2. Would repeat CBC in 1-2 weeks 3. Follow up with GI as needed 4. 2 weeks of BID PPI, then once daily afterwards 5. Please consider alternative regimen for arthritis BESIDES nsaid  Discharge Diagnoses:  Principal Problem:   Upper GI bleed Active Problems:   Hypertension   Anemia   Leucocytosis   HTN (hypertension)   Acute GI bleeding   Duodenal ulcer hemorrhage   Discharge Condition: Improved  Diet recommendation: Regular  Filed Weights   01/06/14 1000  Weight: 79.9 kg (176 lb 2.4 oz)    History of present illness:  Donald Church is a 68 y.o. male with history of hypertension and hyperlipidemia who has been taking NSAIDs for knee pain and previous history of alcohol abuse quit one year ago started experiencing shortness of breath and palpitations off and on over the last 2 days. Patient started noticing black stools over the last 3 days. Denies any abdominal pain nausea vomiting diarrhea. Denies any chest pain though he has had palpitations. In the ER patient's hemoglobin is found to be around 4 and last year around this time patient's hemoglobin is around 13. In the ER patient was found to be mildly hypotensive and was given 3 L normal saline bolus. Patient has been type and screen and PRBCs has been ordered for transfusion. Critical care was consulted and they'll be seeing patient in consult. In addition patient's labs show leukocytosis. Patient will be admitted for further management. Patient denies having had any previous episodes of GI bleed.   Hospital Course:  #1. Acute GI bleed - S/p egd revealing bleeding duodenal ulcer s/p clipping and epi. Improved with PRBC transfusion Start PO iron.  #2. Acute  blood loss anemia - Improved. Will discharge home with iron PO #3. History of hypertension - BP remains soft, antihypertensives cont to be on hold.  #4. History of hyperlipidemia - continue present medications once patient can take orally.  #5. Previous history of alcohol abuse quit one year ago.  #6. Leukocytosis - probably reactionary but given history of alcoholism previously and possible variceal bleed was placed empirically on antibiotics. Chest x-ray neg. Afebrile.   Procedures:  EGD 01/06/14  Consultations:  GI  Discharge Exam: Filed Vitals:   01/08/14 0900 01/08/14 1000 01/08/14 1100 01/08/14 1200  BP:  108/59  118/76  Pulse: 77 64 54 72  Temp:      TempSrc:      Resp: 18 24 12 18   Height:      Weight:      SpO2: 98% 98% 99% 100%    General: awake, in nad Cardiovascular: regular, s1, s2 Respiratory: normal resp effort, no wheezing  Discharge Instructions     Medication List    STOP taking these medications       ibuprofen 200 MG tablet  Commonly known as:  ADVIL,MOTRIN      TAKE these medications       aspirin EC 81 MG tablet  Take 81 mg by mouth daily.     atorvastatin 10 MG tablet  Commonly known as:  LIPITOR  Take 10 mg by mouth daily before breakfast.     ferrous sulfate 325 (65 FE) MG tablet  Take 1 tablet (325 mg total) by mouth daily  with breakfast.     multivitamin with minerals Tabs tablet  Take 1 tablet by mouth daily.     NIFEdipine 60 MG 24 hr tablet  Commonly known as:  PROCARDIA XL/ADALAT-CC  Take 60 mg by mouth daily before breakfast.     pantoprazole 40 MG tablet  Commonly known as:  PROTONIX  Take 1 tablet (40 mg total) by mouth 2 (two) times daily.       No Known Allergies Follow-up Information   Follow up with Joycelyn RuaMEYERS, Yaviel Kloster, MD. Schedule an appointment as soon as possible for a visit in 1 week.   Specialty:  Family Medicine   Contact information:   [address] [place] [address] [phone]         The results of significant diagnostics from this hospitalization (including imaging, microbiology, ancillary and laboratory) are listed below for reference.    Significant Diagnostic Studies: Dg Chest Port 1 View  01/06/2014   CLINICAL DATA:  Shortness of breath and chest pain  EXAM: PORTABLE CHEST - 1 VIEW  COMPARISON:  Chest CT December 24, 2012  FINDINGS: There is no edema or consolidation. Heart is upper normal in size with normal pulmonary vascularity. No adenopathy. No bone lesions.  IMPRESSION: No edema or consolidation.   Electronically Signed   By: Bretta BangWilliam  Woodruff M.D.   On: 01/06/2014 07:02    Microbiology: Recent Results (from the past 240 hour(s))  MRSA PCR SCREENING     Status: None   Collection Time    01/06/14  9:36 AM      Result Value Ref Range Status   MRSA by PCR NEGATIVE  NEGATIVE Final   Comment:            The GeneXpert MRSA Assay (FDA     approved for NASAL specimens     only), is one component of a     comprehensive MRSA colonization     surveillance program. It is not     intended to diagnose MRSA     infection nor to guide or     monitor treatment for     MRSA infections.     Labs: Basic Metabolic Panel:  Recent Labs Lab 01/06/14 0435 01/06/14 1400 01/07/14 0320 01/08/14 0325  NA 136* 140 142 140  K 3.4* 3.0* 3.0* 3.6*  CL 105 111 110 109  CO2 21 18* 21 22  GLUCOSE 120* 118* 111* 106*  BUN 27* 17 9 8   CREATININE 0.70 0.65 0.64 0.70  CALCIUM 7.8* 6.9* 7.1* 7.8*   Liver Function Tests:  Recent Labs Lab 01/06/14 1400  AST 16  ALT 9  ALKPHOS 26*  BILITOT <0.2*  PROT 4.3*  ALBUMIN 2.4*   No results found for this basename: LIPASE, AMYLASE,  in the last 168 hours No results found for this basename: AMMONIA,  in the last 168 hours CBC:  Recent Labs Lab 01/06/14 0435 01/06/14 1400 01/07/14 0320 01/08/14 0325  WBC 16.7* 14.6* 14.0* 9.8  NEUTROABS 12.5* 11.1*  --   --   HGB 4.3* 5.6* 7.5* 8.0*  HCT 13.0* 16.7* 22.1* 24.1*   MCV 97.7 94.4 90.2 91.6  PLT 262 225 237 287   Cardiac Enzymes:  Recent Labs Lab 01/06/14 0716  TROPONINI <0.30   BNP: BNP (last 3 results) No results found for this basename: PROBNP,  in the last 8760 hours CBG:  Recent Labs Lab 01/07/14 1912 01/07/14 2347 01/08/14 0327 01/08/14 0733 01/08/14 1148  GLUCAP  99 94 99 103* 88   Signed:  Bernice Mcauliffe K  [facility] 01/08/2014, 12:50 PM

## 2014-02-01 ENCOUNTER — Encounter: Payer: Self-pay | Admitting: *Deleted

## 2014-02-02 IMAGING — CT CT HEAD W/O CM
2 series · 16 of 30 positions shown, 20 images · non-contrast
Comparison: None.

CLINICAL DATA: Headache on the right.

CT HEAD WITHOUT CONTRAST
TECHNIQUE: Contiguous axial images were obtained from the base of
the skull through the vertex without contrast.

[Series 2: head w/o · axial · non-contrast · 0.43mm/px · z∈[-140,-25]mm · 13 of 27 slices shown, 17 images]
[im 2/27  brain]
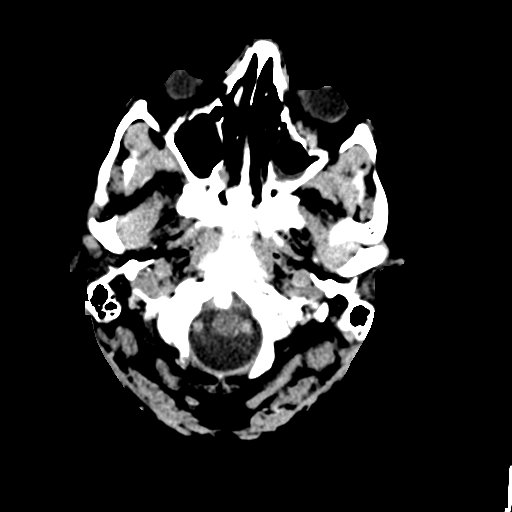
[im 2/27  bone]
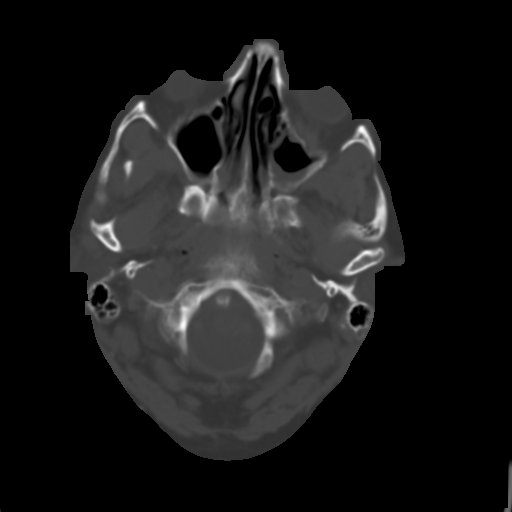
[im 4/27  brain]
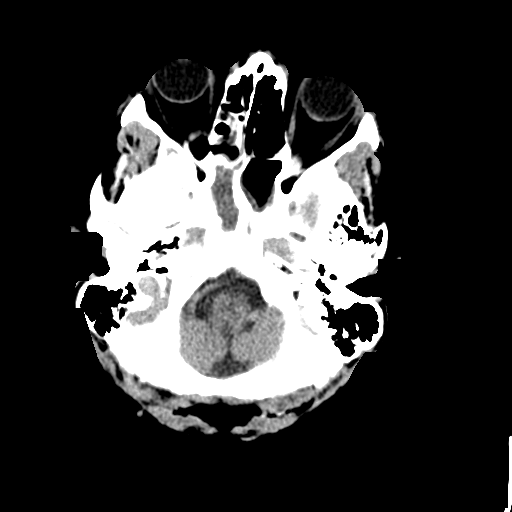
[im 6/27  brain]
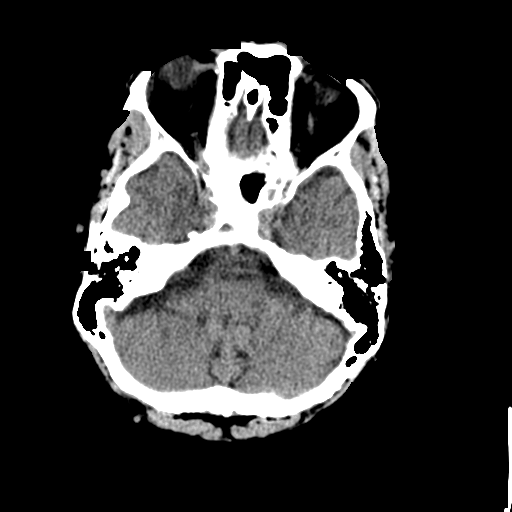
[im 8/27  brain]
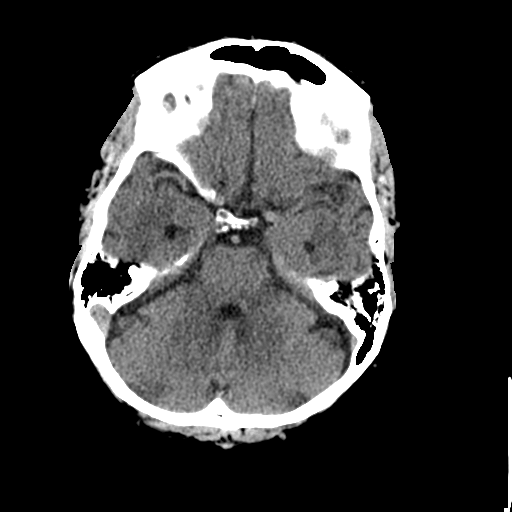
[im 10/27  brain]
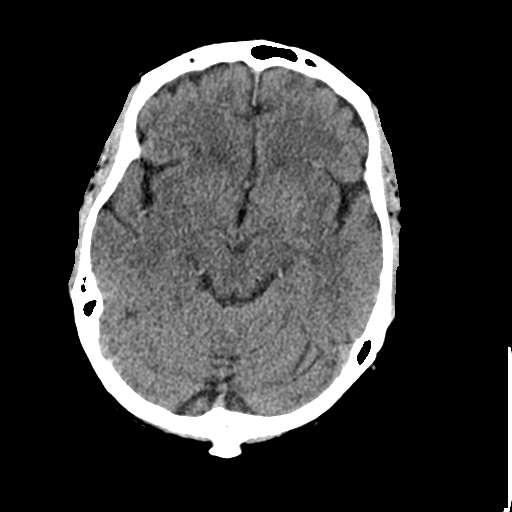
[im 10/27  bone]
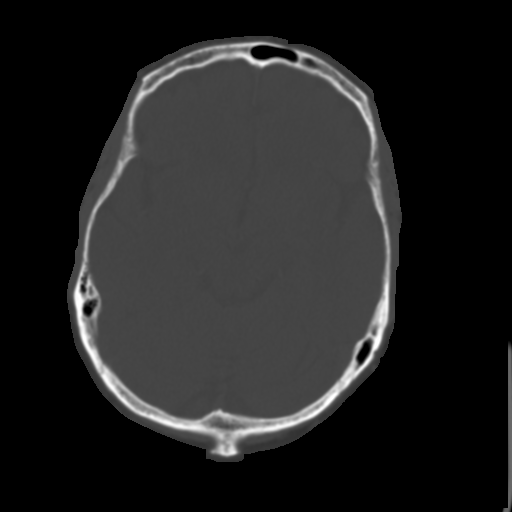
[im 12/27  brain]
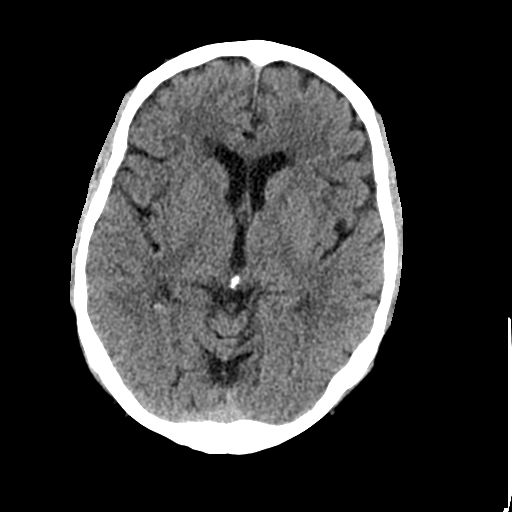
[im 14/27  brain]
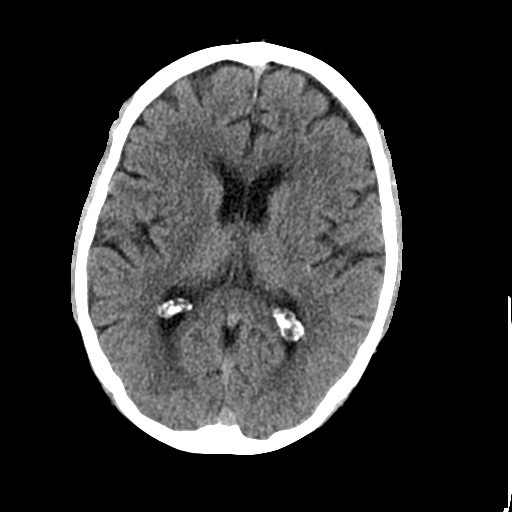
[im 15/27  brain]
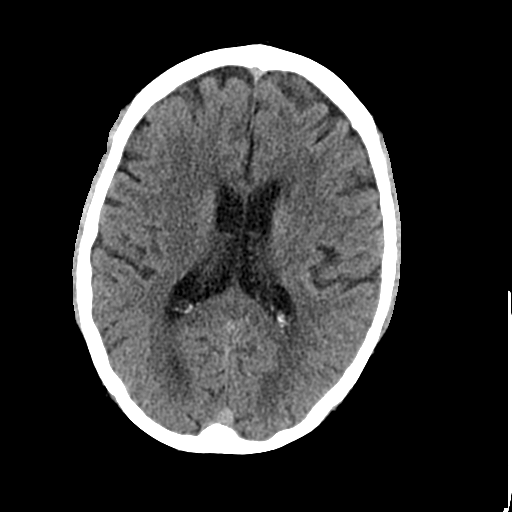
[im 17/27  brain]
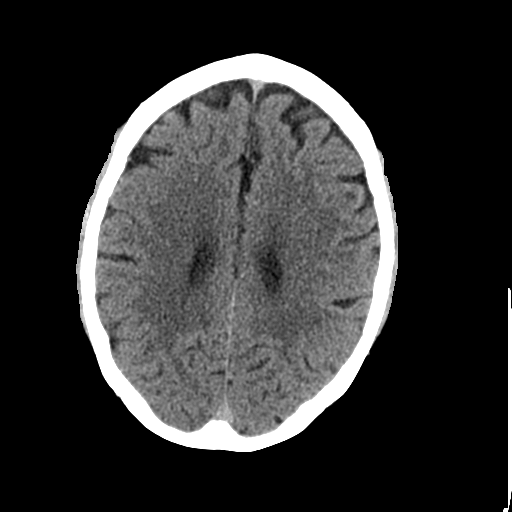
[im 17/27  bone]
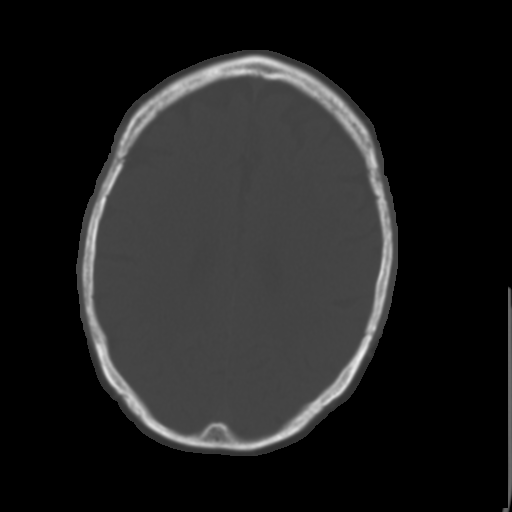
[im 19/27  brain]
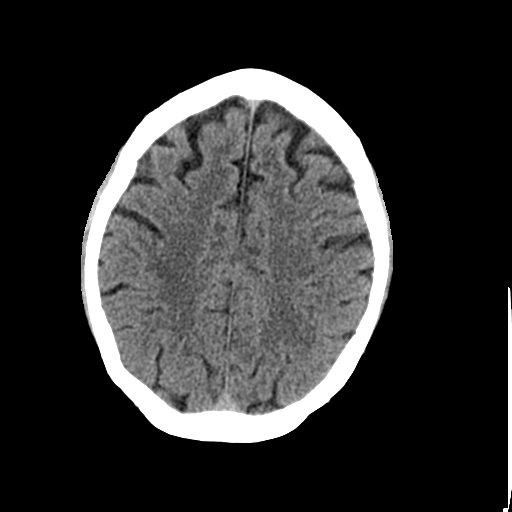
[im 21/27  brain]
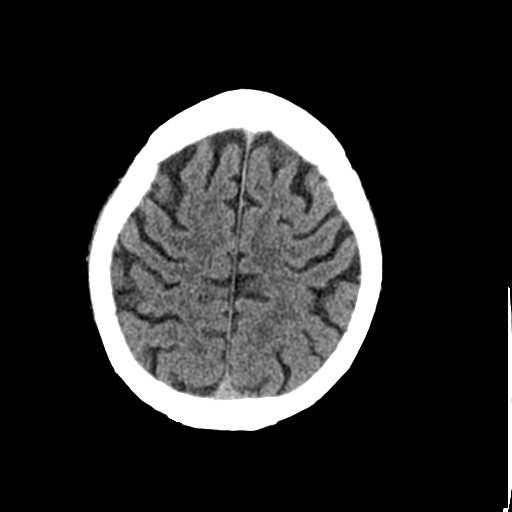
[im 23/27  brain]
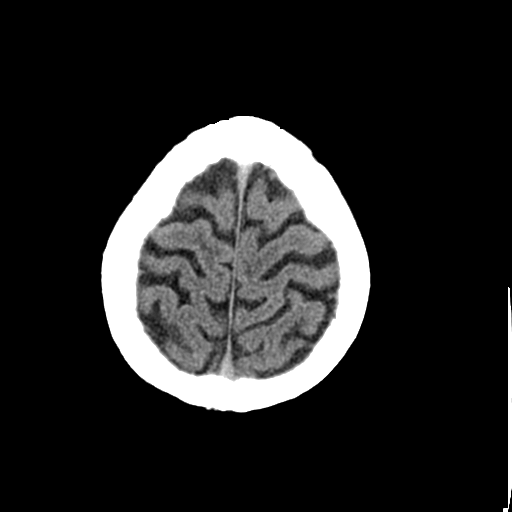
[im 25/27  brain]
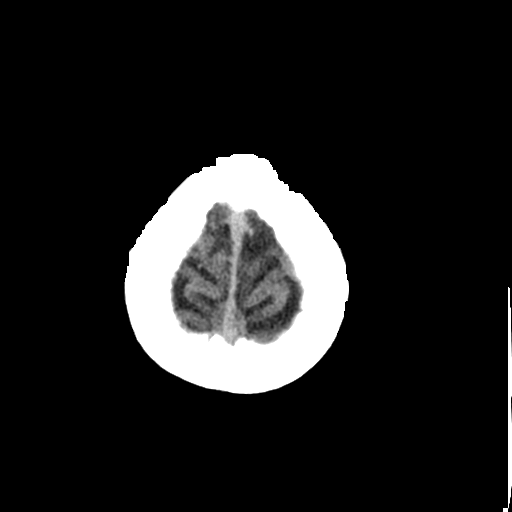
[im 25/27  bone]
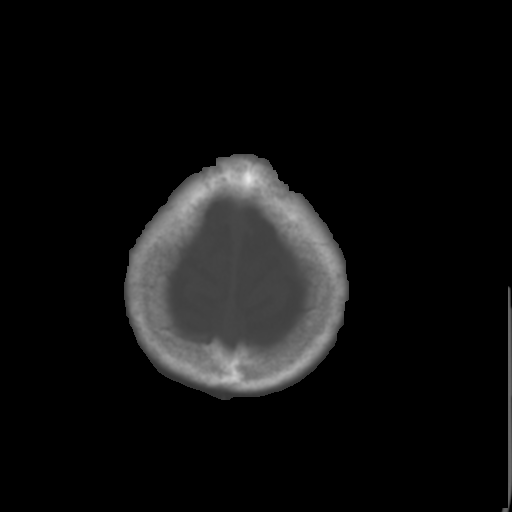

[Series 3: bone windows · axial · 0.43mm/px · z∈[-140,-100]mm · 3 of 27 slices shown]
[im 2/27  bone]
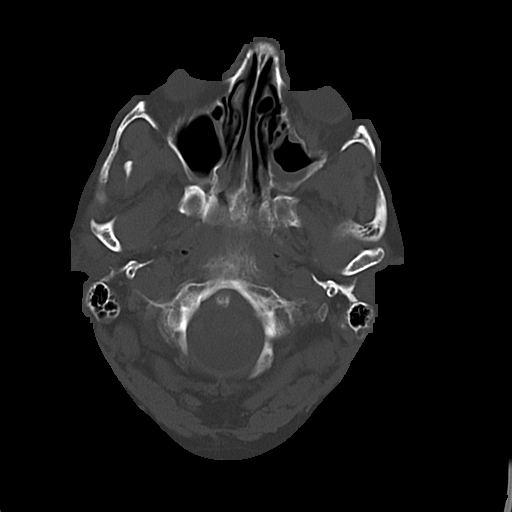
[im 6/27  bone]
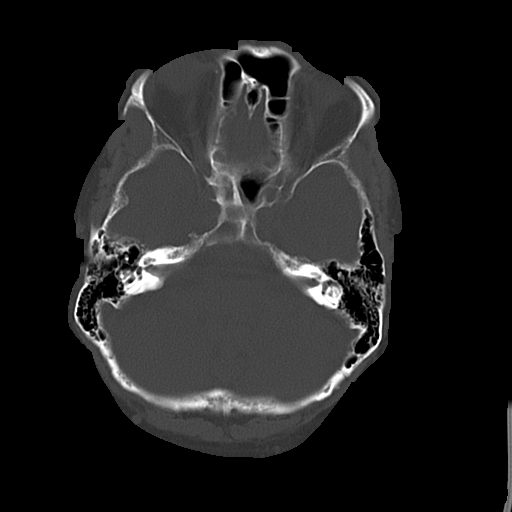
[im 10/27  bone]
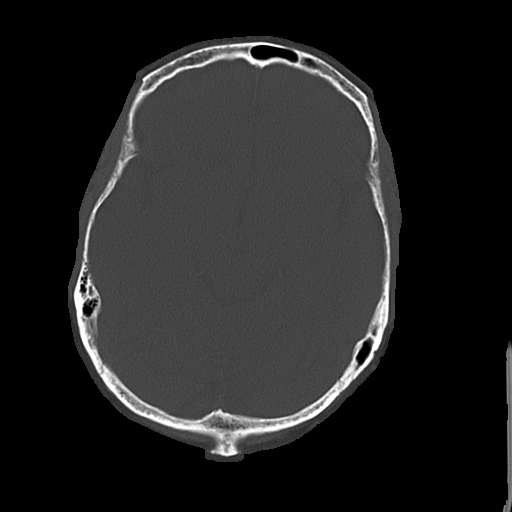

[16 of 30 positions shown; findings below may reference images not displayed]

FINDINGS: The brain has a normal appearance without evidence of old
or acute infarction, mass lesion, hemorrhage, hydrocephalus or
extra-axial collection.  The calvarium is unremarkable.  There are
inflammatory changes of the paranasal sinuses, most notable in the
ethmoid and sphenoid region right more than left.  This could
relate to the patient's pain.
IMPRESSION: No acute intracranial abnormality.  Inflammatory disease of the
paranasal sinuses particularly effecting the right sphenoid and
ethmoid region, which could relate to right-sided headache.

## 2014-02-03 IMAGING — US US ABDOMEN COMPLETE
1 series · 13 of 25 positions shown · non-contrast
Comparison: None

CLINICAL DATA: Hypertension, hyperlipidemia, abnormal
LFTs/transaminitis, alcohol withdrawal

ULTRASOUND ABDOMEN:
TECHNIQUE: Sonography of upper abdominal structures was performed.

[Series 1: us abdomen complete · 0.47mm/px · 13 of 76 slices shown]
[im 1/76]
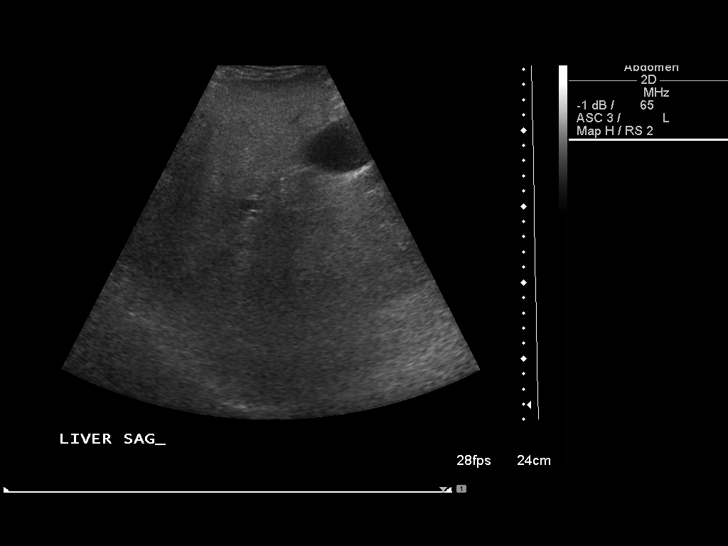
[im 7/76]
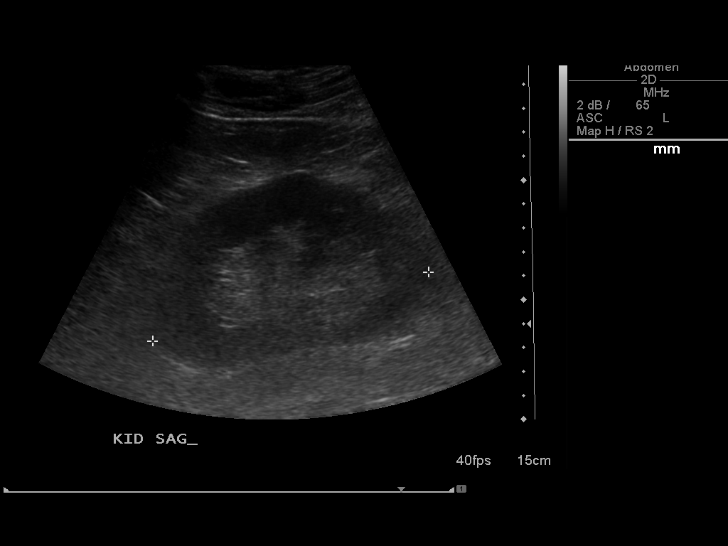
[im 13/76]
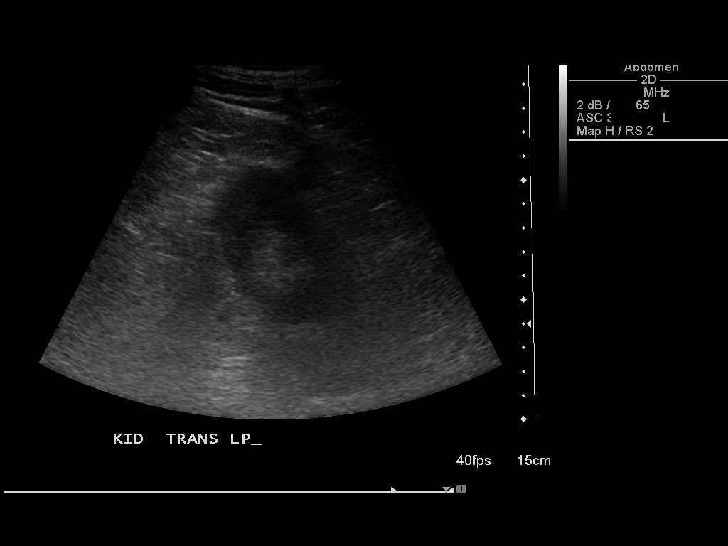
[im 19/76]
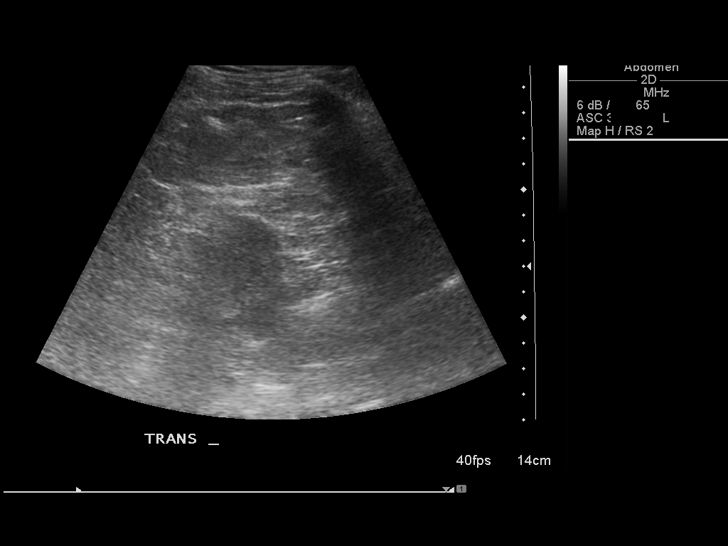
[im 26/76]
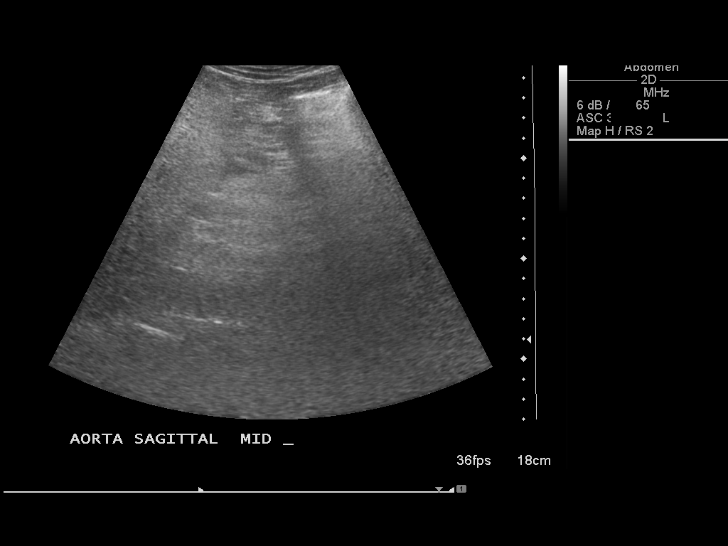
[im 32/76]
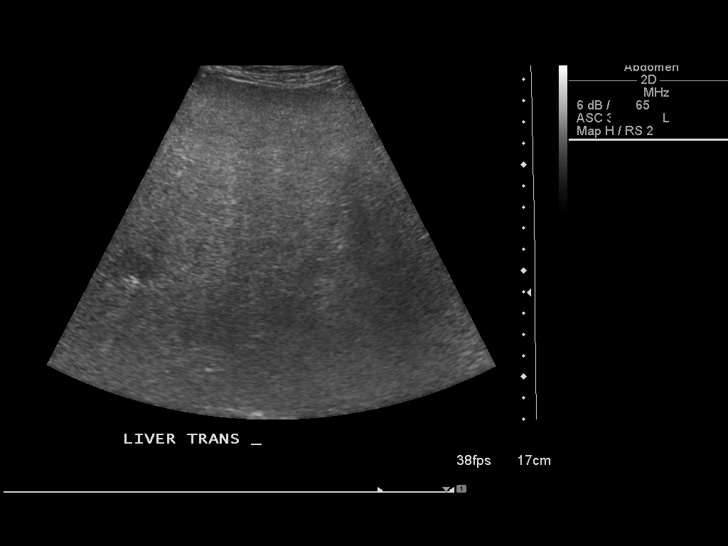
[im 38/76]
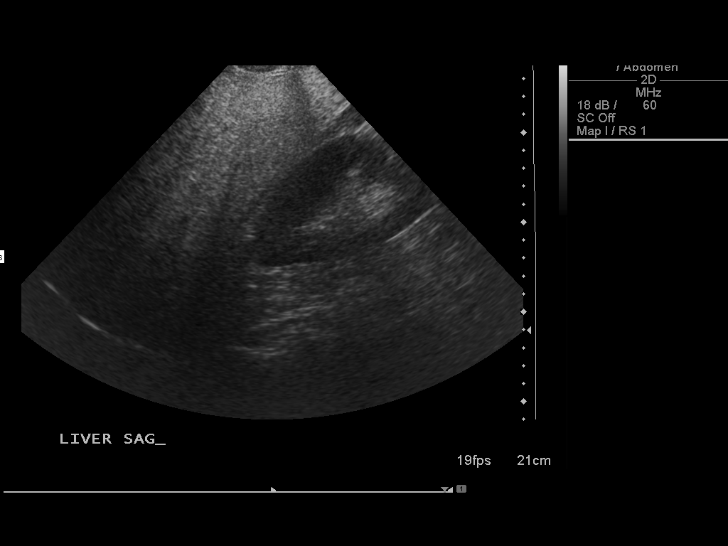
[im 44/76]
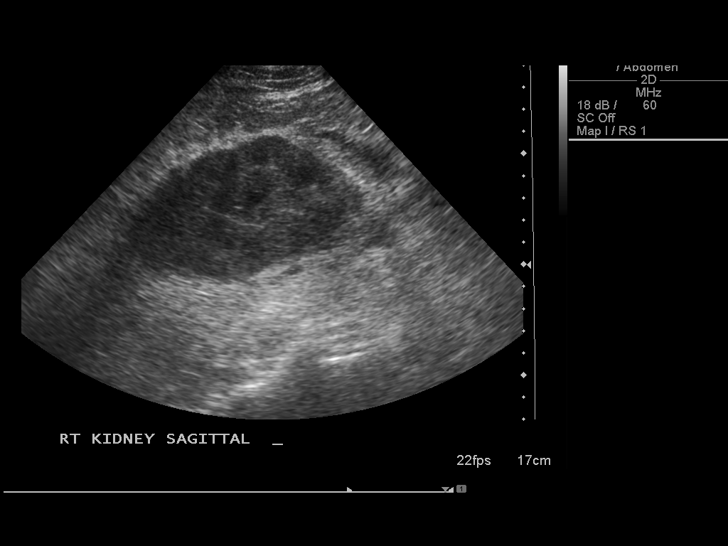
[im 51/76]
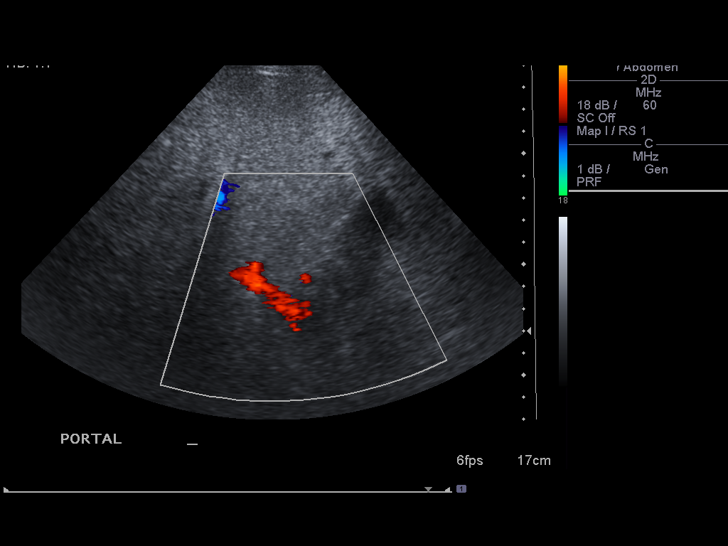
[im 57/76]
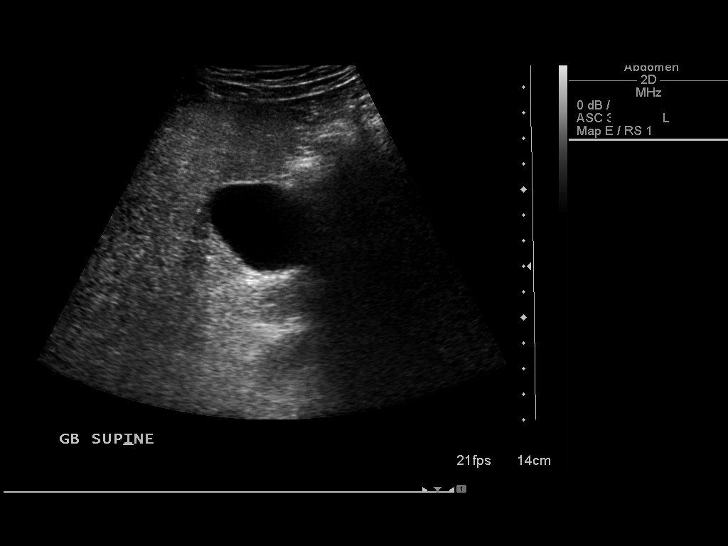
[im 63/76]
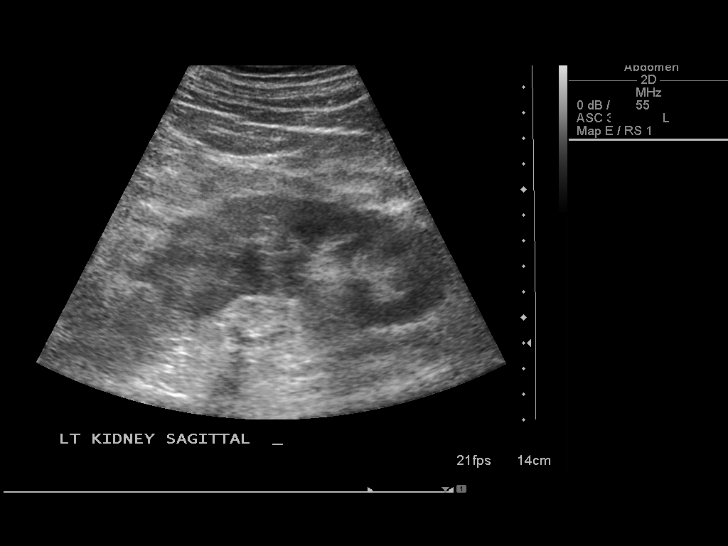
[im 69/76]
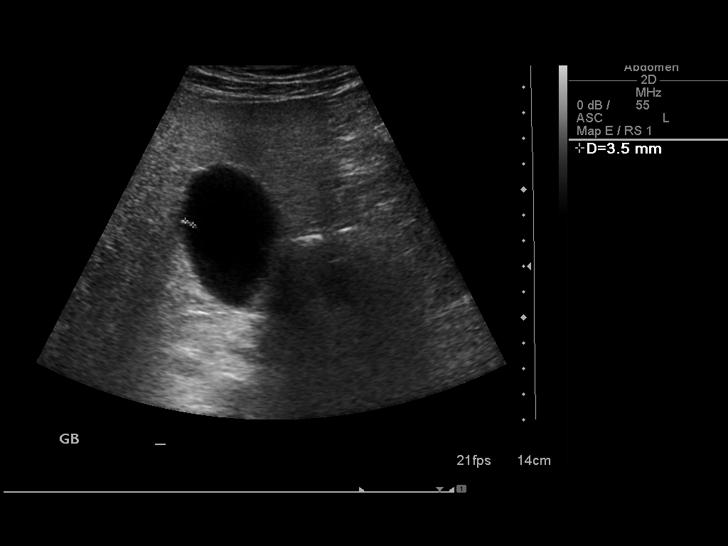
[im 76/76]
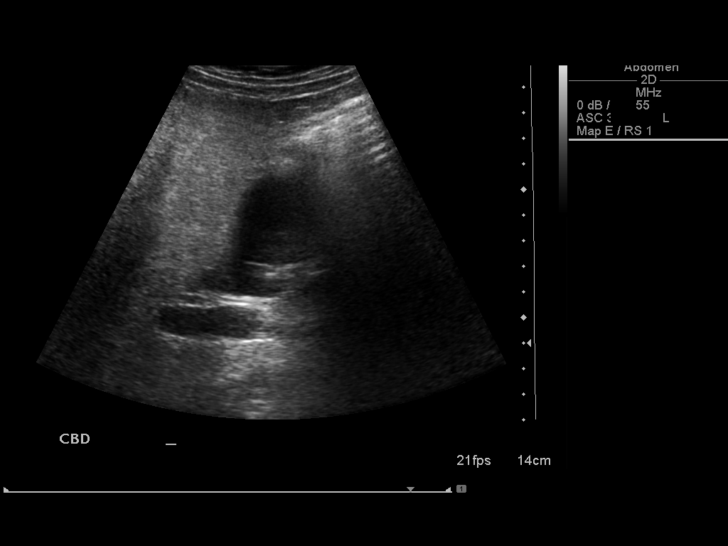

[13 of 25 positions shown; findings below may reference images not displayed]

Gallbladder:  Tiny intraluminal nonshadowing echogenic focus
question tiny polyp 4 mm diameter.  No definite shadowing
gallstones, wall thickening or pericholecystic fluid.  No
sonographic Murphy's sign.

Common bile duct:  4 mm diameter, normal.

Liver:  Echogenic, likely fatty infiltration, though this can be
seen with cirrhosis and certain infiltrative disorders.  Poor sound
transmission through liver limiting assessment.  No gross hepatic
mass or nodularity.  Unable to exclude intrahepatic pathology by
this exam.  Hepatopetal portal venous flow.

IVC:  Normal appearance

Pancreas:  Incomplete visualization of portions of the head and
tail with visualized portions normal appearance.

Spleen:  Normal appearance, 6.2 cm length

Right kidney:  12.2 cm length. Normal morphology without mass or
hydronephrosis.

Left kidney:  12.5 cm length. Normal morphology without mass or
hydronephrosis.

Aorta:  Normal caliber

Other:  No free fluid
IMPRESSION: Tiny gallbladder polyp.
Probable fatty infiltration of liver as above.
Inadequate assessment of intrahepatic detail due to poor sound
transmission through a markedly echogenic liver, unable to exclude
intrahepatic pathology by this exam.
If better intrahepatic visualization is required recommend
evaluation by CT with IV and oral contrast.

## 2014-02-05 ENCOUNTER — Encounter: Payer: Self-pay | Admitting: Nurse Practitioner

## 2014-02-05 ENCOUNTER — Ambulatory Visit (INDEPENDENT_AMBULATORY_CARE_PROVIDER_SITE_OTHER): Payer: BC Managed Care – PPO | Admitting: Nurse Practitioner

## 2014-02-05 VITALS — BP 156/92 | HR 82 | Ht 67.0 in | Wt 176.0 lb

## 2014-02-05 DIAGNOSIS — D649 Anemia, unspecified: Secondary | ICD-10-CM

## 2014-02-05 DIAGNOSIS — K264 Chronic or unspecified duodenal ulcer with hemorrhage: Secondary | ICD-10-CM

## 2014-02-05 NOTE — Patient Instructions (Signed)
Call us for any problems you may have.

## 2014-02-05 NOTE — Progress Notes (Signed)
    HPI :  Patient is a 68 year old male known to Dr. Jarold MottoPatterson for screening colonoscopies. Patient was hospitalized last month with upper GI bleed. EGD revealed a bleeding duodenal ulcer which was injected with epinephrine and clipped. No further bleeding. Patient had been taking NSAIDs for knee pain. His H. pylori serology was negative. Patient is here for a hospital followup. Feels great, no pain.  Past Medical History  Diagnosis Date  . Hypertension   . Arthritis   . Hyperlipidemia   . Colon polyp, hyperplastic 06/24/2009  . Bleeding duodenal ulcer 01/06/2014    s/p epinephrine injection and clip placement  . Esophageal stricture 01/06/2014  . Alcohol abuse     Family History  Problem Relation Age of Onset  . Lymphoma Father   . Leukemia Mother    History  Substance Use Topics  . Smoking status: Former Smoker    Quit date: 12/24/1978  . Smokeless tobacco: Never Used  . Alcohol Use: No   Current Outpatient Prescriptions  Medication Sig Dispense Refill  . atorvastatin (LIPITOR) 10 MG tablet Take 10 mg by mouth daily before breakfast.      . ferrous sulfate 325 (65 FE) MG tablet Take 1 tablet (325 mg total) by mouth daily with breakfast.  30 tablet  0  . Multiple Vitamin (MULTIVITAMIN WITH MINERALS) TABS Take 1 tablet by mouth daily.      . pantoprazole (PROTONIX) 40 MG tablet Take 40 mg by mouth daily.      Marland Kitchen. NIFEdipine (PROCARDIA XL/ADALAT-CC) 60 MG 24 hr tablet Take 60 mg by mouth daily before breakfast.       No current facility-administered medications for this visit.   No Known Allergies   Review of Systems: All systems reviewed and negative except where noted in HPI.    Physical Exam: BP 156/92  Pulse 82  Ht 5\' 7"  (1.702 m)  Wt 176 lb (79.833 kg)  BMI 27.56 kg/m2 Constitutional: Pleasant,well-developed, white male in no acute distress. HEENT: Normocephalic and atraumatic. Conjunctivae are normal. No scleral icterus. Neck supple.  Cardiovascular: Normal  rate, regular rhythm.  Pulmonary/chest: Effort normal and breath sounds normal. No wheezing, rales or rhonchi. Abdominal: Soft, nondistended, nontender. Bowel sounds active throughout. There are no masses palpable. No hepatomegaly. Extremities: no edema Lymphadenopathy: No cervical adenopathy noted. Neurological: Alert and oriented to person place and time. Skin: Skin is warm and dry. No rashes noted. Psychiatric: Normal mood and affect. Behavior is normal.   ASSESSMENT AND PLAN:  1. Recent upper GI bleed, s/p EGD with injection and endoclip placement to bleeding duodenal ulcer. No longer taking NSAIDS. On PPI for almost a month now. Recommend he continue PPI for another two weeks then can d/c.   2. Anemia of acute blood loss requiring transfusion. Hgb 4.3, down from baseline of 14-16.  Hgb by discharge was 9. Recent labs hgb of 11.6. He is taking daily iron.   3. Colon cancer screening. Next one due in 2020.

## 2014-02-06 NOTE — Progress Notes (Signed)
I will forward to Dr. Arlyce DiceKaplan who consulted on patient 11/2013, did his EGD

## 2015-02-15 IMAGING — CR DG CHEST 1V PORT
1 series · 1 of 1 positions shown · non-contrast
Comparison: Chest CT December 24, 2012

CLINICAL DATA: Shortness of breath and chest pain

EXAM:
PORTABLE CHEST - 1 VIEW

[AP]
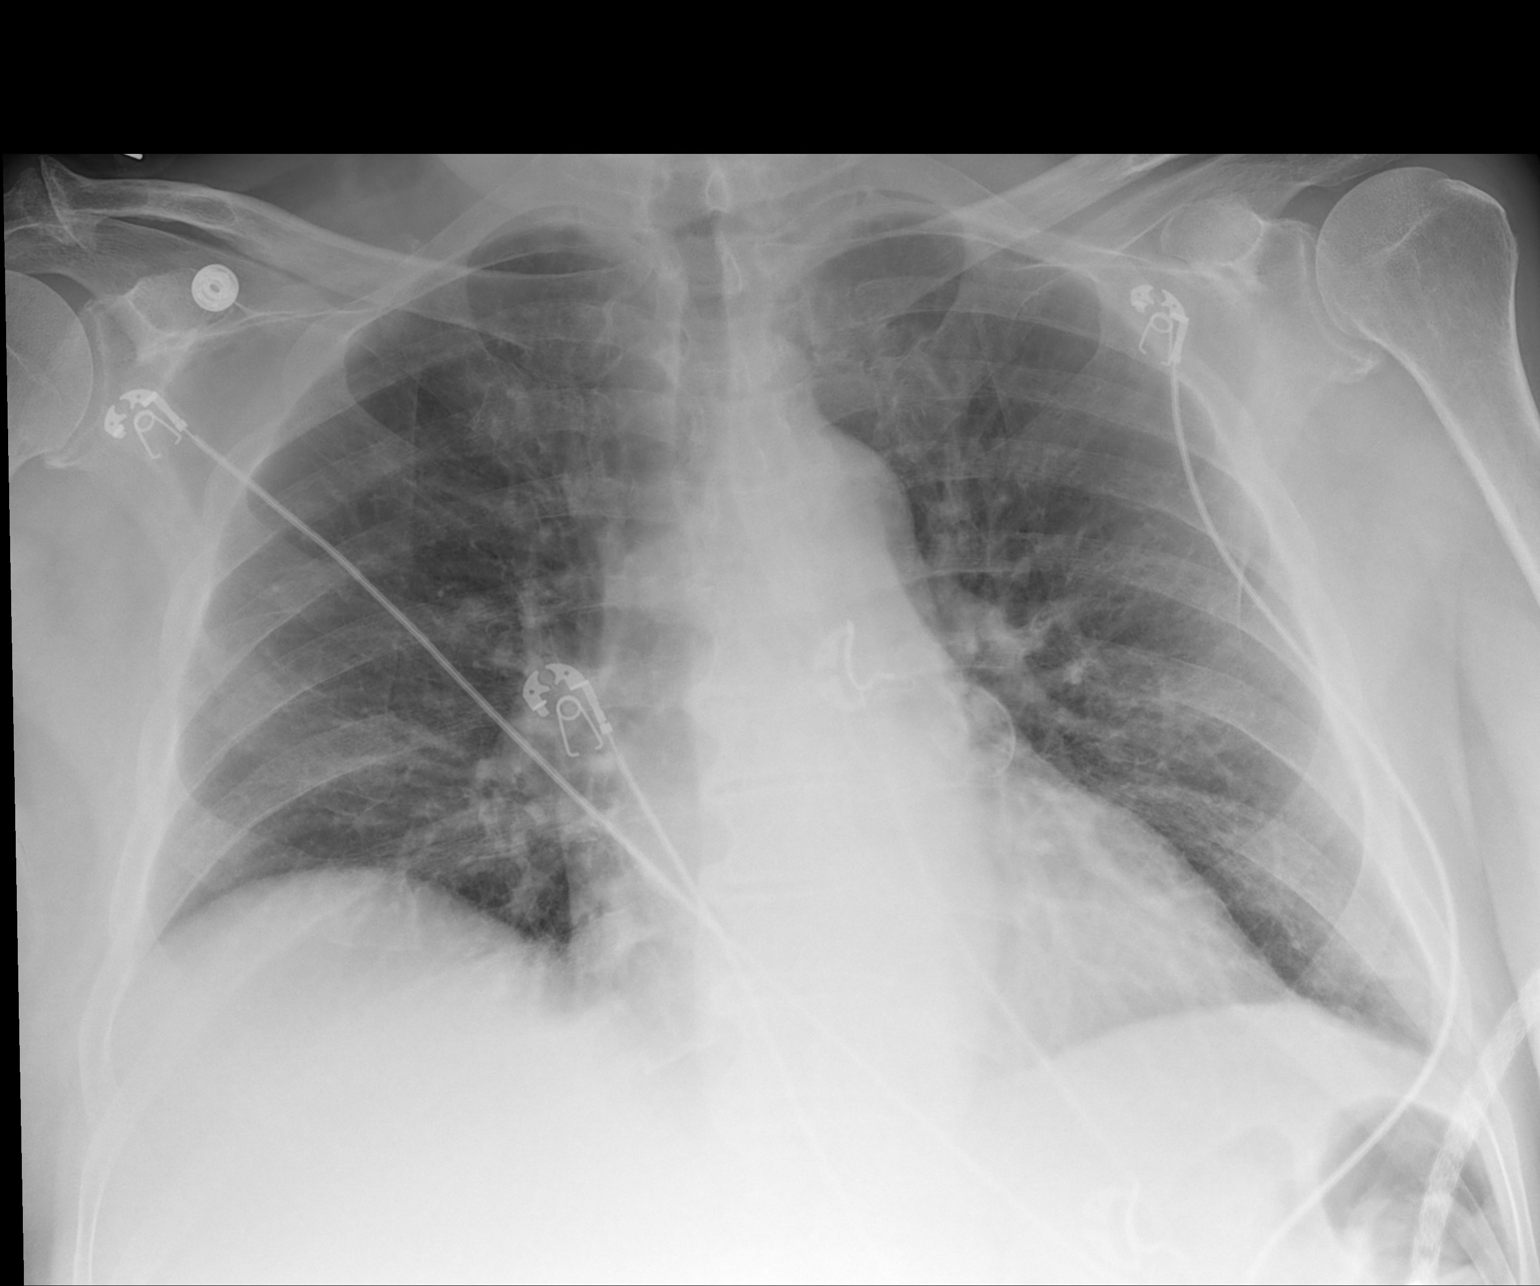

[1 of 1 positions shown; findings below may reference images not displayed]

FINDINGS: There is no edema or consolidation. Heart is upper normal in size
with normal pulmonary vascularity. No adenopathy. No bone lesions.
IMPRESSION: No edema or consolidation.

## 2016-04-04 ENCOUNTER — Encounter: Payer: Self-pay | Admitting: Gastroenterology

## 2019-06-11 ENCOUNTER — Encounter: Payer: Self-pay | Admitting: Gastroenterology
